# Patient Record
Sex: Female | Born: 1980 | Race: White | Hispanic: No | Marital: Single | Smoking: Never smoker
Health system: Southern US, Community
[De-identification: ages and names within clinical notes are randomized; demographics above are authoritative.]

## PROBLEM LIST (undated history)

## (undated) DIAGNOSIS — Z9889 Other specified postprocedural states: Secondary | ICD-10-CM

## (undated) DIAGNOSIS — J189 Pneumonia, unspecified organism: Secondary | ICD-10-CM

## (undated) DIAGNOSIS — A498 Other bacterial infections of unspecified site: Secondary | ICD-10-CM

## (undated) DIAGNOSIS — R112 Nausea with vomiting, unspecified: Secondary | ICD-10-CM

## (undated) HISTORY — PX: CHOLECYSTECTOMY: SHX55

---
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
## 1998-12-13 ENCOUNTER — Ambulatory Visit (HOSPITAL_COMMUNITY): Admission: RE | Admit: 1998-12-13 | Discharge: 1998-12-13 | Payer: Self-pay | Admitting: Orthopedic Surgery

## 1998-12-13 ENCOUNTER — Encounter: Payer: Self-pay | Admitting: Orthopedic Surgery

## 1999-05-30 ENCOUNTER — Other Ambulatory Visit: Admission: RE | Admit: 1999-05-30 | Discharge: 1999-05-30 | Payer: Self-pay | Admitting: *Deleted

## 2000-03-31 ENCOUNTER — Encounter: Admission: RE | Admit: 2000-03-31 | Discharge: 2000-06-29 | Payer: Self-pay | Admitting: Internal Medicine

## 2000-04-21 ENCOUNTER — Encounter: Admission: RE | Admit: 2000-04-21 | Discharge: 2000-04-21 | Payer: Self-pay | Admitting: Internal Medicine

## 2000-04-21 ENCOUNTER — Encounter: Payer: Self-pay | Admitting: Internal Medicine

## 2000-05-19 ENCOUNTER — Other Ambulatory Visit: Admission: RE | Admit: 2000-05-19 | Discharge: 2000-05-19 | Payer: Self-pay | Admitting: Internal Medicine

## 2000-09-17 ENCOUNTER — Ambulatory Visit (HOSPITAL_BASED_OUTPATIENT_CLINIC_OR_DEPARTMENT_OTHER): Admission: RE | Admit: 2000-09-17 | Discharge: 2000-09-17 | Payer: Self-pay | Admitting: Orthopedic Surgery

## 2001-05-13 ENCOUNTER — Encounter: Payer: Self-pay | Admitting: General Surgery

## 2001-05-13 ENCOUNTER — Emergency Department (HOSPITAL_COMMUNITY): Admission: EM | Admit: 2001-05-13 | Discharge: 2001-05-13 | Payer: Self-pay | Admitting: General Surgery

## 2001-09-30 ENCOUNTER — Emergency Department (HOSPITAL_COMMUNITY): Admission: EM | Admit: 2001-09-30 | Discharge: 2001-10-01 | Payer: Self-pay | Admitting: Emergency Medicine

## 2003-08-16 ENCOUNTER — Other Ambulatory Visit: Admission: RE | Admit: 2003-08-16 | Discharge: 2003-08-16 | Payer: Self-pay | Admitting: Obstetrics and Gynecology

## 2005-08-15 ENCOUNTER — Inpatient Hospital Stay (HOSPITAL_COMMUNITY): Admission: AD | Admit: 2005-08-15 | Discharge: 2005-08-15 | Payer: Self-pay | Admitting: Obstetrics and Gynecology

## 2005-08-17 ENCOUNTER — Inpatient Hospital Stay (HOSPITAL_COMMUNITY): Admission: AD | Admit: 2005-08-17 | Discharge: 2005-08-20 | Payer: Self-pay | Admitting: *Deleted

## 2006-05-29 ENCOUNTER — Ambulatory Visit: Payer: Self-pay | Admitting: Endocrinology

## 2007-07-05 ENCOUNTER — Encounter: Payer: Self-pay | Admitting: *Deleted

## 2007-07-05 DIAGNOSIS — N951 Menopausal and female climacteric states: Secondary | ICD-10-CM | POA: Insufficient documentation

## 2007-07-05 DIAGNOSIS — E538 Deficiency of other specified B group vitamins: Secondary | ICD-10-CM | POA: Insufficient documentation

## 2007-07-05 DIAGNOSIS — J309 Allergic rhinitis, unspecified: Secondary | ICD-10-CM | POA: Insufficient documentation

## 2007-11-21 ENCOUNTER — Inpatient Hospital Stay (HOSPITAL_COMMUNITY): Admission: AD | Admit: 2007-11-21 | Discharge: 2007-11-21 | Payer: Self-pay | Admitting: Obstetrics and Gynecology

## 2007-12-12 ENCOUNTER — Inpatient Hospital Stay (HOSPITAL_COMMUNITY): Admission: AD | Admit: 2007-12-12 | Discharge: 2007-12-12 | Payer: Self-pay | Admitting: Obstetrics & Gynecology

## 2007-12-19 ENCOUNTER — Inpatient Hospital Stay (HOSPITAL_COMMUNITY): Admission: AD | Admit: 2007-12-19 | Discharge: 2007-12-19 | Payer: Self-pay | Admitting: Obstetrics

## 2008-01-05 ENCOUNTER — Inpatient Hospital Stay (HOSPITAL_COMMUNITY): Admission: AD | Admit: 2008-01-05 | Discharge: 2008-01-06 | Payer: Self-pay | Admitting: *Deleted

## 2008-01-20 ENCOUNTER — Inpatient Hospital Stay (HOSPITAL_COMMUNITY): Admission: AD | Admit: 2008-01-20 | Discharge: 2008-01-20 | Payer: Self-pay | Admitting: Obstetrics and Gynecology

## 2008-01-21 ENCOUNTER — Inpatient Hospital Stay (HOSPITAL_COMMUNITY): Admission: AD | Admit: 2008-01-21 | Discharge: 2008-01-30 | Payer: Self-pay | Admitting: Obstetrics and Gynecology

## 2008-01-27 ENCOUNTER — Encounter (INDEPENDENT_AMBULATORY_CARE_PROVIDER_SITE_OTHER): Payer: Self-pay | Admitting: Obstetrics and Gynecology

## 2008-11-20 ENCOUNTER — Ambulatory Visit (HOSPITAL_COMMUNITY): Admission: RE | Admit: 2008-11-20 | Discharge: 2008-11-20 | Payer: Self-pay | Admitting: Gastroenterology

## 2008-11-28 ENCOUNTER — Ambulatory Visit (HOSPITAL_COMMUNITY): Admission: RE | Admit: 2008-11-28 | Discharge: 2008-11-28 | Payer: Self-pay | Admitting: Gastroenterology

## 2009-08-24 ENCOUNTER — Ambulatory Visit (HOSPITAL_BASED_OUTPATIENT_CLINIC_OR_DEPARTMENT_OTHER): Admission: RE | Admit: 2009-08-24 | Discharge: 2009-08-24 | Payer: Self-pay | Admitting: Orthopedic Surgery

## 2009-10-06 HISTORY — PX: WRIST GANGLION EXCISION: SUR520

## 2010-10-06 HISTORY — PX: OTHER SURGICAL HISTORY: SHX169

## 2011-02-18 NOTE — Op Note (Signed)
Kathryn [name], Kathryn [name] NO.:  [id number]   MEDICAL RECORD NO.:  [id number]          PATIENT TYPE:  INP   LOCATION:  9161                          [facility]   PHYSICIAN:  Lenoard Aden, M.D.DATE OF BIRTH:  1980/11/24   DATE OF PROCEDURE:  01/27/2008  DATE OF DISCHARGE:                               OPERATIVE REPORT   PREOPERATIVE DIAGNOSES:  1. 37-week intrauterine pregnancy.  2. Preeclampsia with increasing neurologic symptoms.  3. Labile blood pressure.  4. Repeat C-section and tubal ligation.   POSTOPERATIVE DIAGNOSES:  1. 37-week intrauterine pregnancy.  2. Preeclampsia with increasing neurologic symptoms.  3. Labile blood pressure.  4. Repeat C-section and tubal ligation.   PROCEDURE:  Repeat low segment transverse cesarean section and tubal  ligation.   SURGEON:  Lenoard Aden, M.D.   ASSISTANT:  Maxie Better, M.D.   ANESTHESIA:  Spinal by Burnett Corrente, M.D.   ESTIMATED BLOOD LOSS:  1000 mL.   COMPLICATIONS:  None.   DRAINS:  Foley.   COUNTS:  Correct.   The patient the patient recovery in good condition.   SPECIMEN:  Placenta, questionable solid mass from the lower uterine  segment and tubal segments to pathology.   DESCRIPTION OF PROCEDURE:  An being apprised of the risks of anesthesia,  infection, bleeding, injury to abdominal organs and need for repair,  delayed versus immediate complications to include bowel and bladder  injury, the patient was brought to the operating room where she was  administered a spinal anesthetic without complications. She was prepped  and draped in the usual sterile fashion, Foley catheter previously  placed. After achieving adequate anesthesia, dilute Marcaine solution  placed, a Pfannenstiel skin incision made in the area of her previous  Pfannenstiel skin incision which was made with a scalpel and carried  down to the fascia which was nicked in the midline transversely using  Mayo  scissors.  The  rectus muscles was dissected sharply in the midline, peritoneum entered  sharply, bladder blade placed.  The visceroperitoneum scored sharply off  the thinning lower uterine segment, hysterotomy incision made,  atraumatic delivery from an occiput transverse position a full-term  living female handed to the pediatricians in attendance after amniotomy  and clear fluid. Apgars 8 and 8. Cord blood collected. The placenta was  delivered manually. Intact three-vessel cord noted.  The uterus  exteriorized.  There is a 2-3 cm spherical oval type mass that is lying  free in the lower uterine segment.  This is sent to pathology for  confirmation.  At this time, the uterine incision is identified, no  extensions are noted. It is closed in one running imbricating layer with  a #0 Monocryl suture. Two interrupted sutures are placed for hemostasis.  Bladder flap inspected and found to be hemostatic. Irrigation  accomplished. The left tube traced out to the fimbriated end and the  isthmic portion of tube identified.  Avascular portion of the  mesosalpinx identified, a window created and distal proximal ties are  placed, zero plain suture.  Tubal segment excised.  Tubal lumen is  cauterized and visualized.  The same procedure is done on the left tube  that is done on the right tube. The tubal lumens are visualized and  cauterized. The tubal segment is sent to pathology.  Good hemostasis  noted, the ovaries appear normal.  Reinspection of the lower uterine  segment reveals a hemostatic incision, bladder flap inspected and  hemostatic.  The fascia then closed using #0 Monocryl in continuous  running fashion, subcutaneous tissue reapproximated using a 2-0 plain in  continuous running fashion, skin closed using staples.  The patient  tolerated this procedure well and was transferred to recovery in good  condition.      Lenoard Aden, M.D.  Electronically Signed     RJT/MEDQ  D:   01/27/2008  T:  01/27/2008  Job:  [id number]

## 2011-02-21 NOTE — Discharge Summary (Signed)
[name], Kathryn [name] NO.:  [id number]   MEDICAL RECORD NO.:  [id number]          PATIENT TYPE:  INP   LOCATION:  9141                          [facility]   PHYSICIAN:  Lenoard Aden, M.D.DATE OF BIRTH:  1981-06-22   DATE OF ADMISSION:  01/21/2008  DATE OF DISCHARGE:  01/30/2008                               DISCHARGE SUMMARY   The patient want to come admission for preeclampsia remote from term.  She admitted inpatient.  She was stable.  Decision based on the symptoms  and blood pressures were made to deliver the patient at 37 weeks.  She  had a complicated repeat C-section.  Postoperative course uncomplicated.  Tolerating a regular diet well.  Discharged to home on hospital day #3  on labetalol, Percocet, prenatal vitamins, and iron.  Follow up in the  office in 4 -6 weeks.      Lenoard Aden, M.D.  Electronically Signed     RJT/MEDQ  D:  03/08/2008  T:  03/09/2008  Job:  [id number]

## 2011-02-21 NOTE — Consult Note (Signed)
[facility]                            ENDOCRINOLOGY CONSULTATION   NAME:Kathryn [name], Kathryn [name]                      MRN:          [id number]  DATE:05/29/2006                            DOB:          [birth year]    ENDOSCOPIC ASSESSMENT:   REFERRED BY:  The patient is self-referred.   REASON FOR REFERRAL:  Suspected abnormality of thyroid.   HISTORY OF PRESENT ILLNESS:  A 30 year old woman with 6 months of fatigue.  She was prescribed Provigil, which initially helped.  However, she states  that she now feels back to her baseline.  She had a severe weight gain of 60  pounds with a pregnancy late last year and earlier this year.  She has lost  back 40 of those pounds, but the weight loss has now leveled off.  She  states she had some edema during the pregnancy due to her preeclampsia, but  the improvement of this since delivery had also leveled off according to the  patient.  She is currently not breast feeding .  She describes her diet and  exercise as good.   PAST MEDICAL HISTORY:  1. ADD.  2. Allergic rhinitis.   SOCIAL HISTORY:  She works as an Regulatory affairs officer.  She  is married.  She has an infant child.   FAMILY HISTORY:  Hypothyroidism in several second degree relatives.   REVIEW OF SYSTEMS:  She denies fever.  She has slight shortness of breath  with exertion.  She also has mild depression.   PHYSICAL EXAMINATION:  VITAL SIGNS:  Blood pressure 122/80.  Heart rate is  103.  Temperature 98.1.  The weight is 182.  GENERAL:  No distress.  SKIN:  Not diaphoretic.  HEENT:  No proptosis, no periorbital swelling.  NECK:  No goiter.  CHEST:  Clear to auscultation.  No respiratory distress.  CARDIOVASCULAR:  No JVD.  I do not appreciate any edema.  Regular rate and  rhythm, no murmur.  EXTREMITIES:  Gait is observed in the office to be normal.  NEUROLOGICAL:  Alert and oriented.  Does not appear anxious nor depressed  and  there is no tremor.   LABORATORY STUDIES:  Laboratory studies on May 29, 2006:  TSH 2.19.  Free  T3 of 3.  Free T4 of 0.6.  (All of these are normal).   IMPRESSION:  1. Apparent history of mildly abnormal thyroid function studies, normal on      today's check.  2. The inability to lose all of the weight she gained during pregnancy is      a common occurrence.  3. Fatigue which is also a common symptom in the mother of an infant.   PLAN:  In view of the normal thyroid function studies, I left her a message  stating that no further thyroid workup is needed and she can return here  p.r.n.                                   Sean A. Everardo All, MD  SAE/MedQ  DD:  05/31/2006  DT:  06/01/2006  Job #:  [id number]   cc:   Merlene Laughter. Renae Gloss, MD  Richardean Sale, MD

## 2011-02-21 NOTE — Op Note (Signed)
Kathryn [name], Kathryn [name]               ACCOUNT NO.:  [id number]   MEDICAL RECORD NO.:  [id number]          PATIENT TYPE:  INP   LOCATION:  9148                          [facility]  [facility]   PHYSICIAN:  [place] B. Earlene Plater, M.D.  DATE OF BIRTH:  [birth year]   DATE OF PROCEDURE:  08/17/2005  DATE OF DISCHARGE:                                 OPERATIVE REPORT   PREOPERATIVE DIAGNOSES:  1.  Thirty-seven weeks, breech.  2.  Spontaneous rupture of membranes.  3.  Hypertension.   POSTOPERATIVE DIAGNOSES:  1.  Thirty-seven weeks, breech.  2.  Spontaneous rupture of membranes.  3.  Hypertension.   OPERATION/PROCEDURE:  Primary low transverse cesarean section.   SURGEON:  Chester Holstein. Earlene Plater, M.D.   ANESTHESIA:  Spinal.   SPECIMENS:  None.   ESTIMATED BLOOD LOSS:  700.   COMPLICATIONS:  None.   OPERATIVE FINDINGS:  Viable female, complete breech, Apgars 8 and 9, weight  7 pounds 1 ounce.  Normal-appearing uterus, tubes and ovaries.   INDICATIONS:  The patient at 37+ weeks and being evaluated for elevated  blood pressure in the 140's/90's range with normal PIH labs. She was  supposed to drop off a 24-hour urine this morning.  However, rupture of  membranes around 1 a.m.  Also breech.  Therefore, recommended cesarean  section.  Risks were discussed including infection, bleeding, damage to  surrounding organs.   DESCRIPTION OF PROCEDURE:  The patient was taken to the operating room and  spinal anesthesia was obtained.  She was prepped and draped in the standard  fashion and Foley catheter inserted into the bladder.  A Pfannenstiel  incision was made with the knife and carried sharply to the fascia. The  fascia was divided sharply.  Posterior rectus muscles were dissected off  sharply.  Posterior sheath and peritoneum elevated and entered sharply.  Bladder blade inserted.  Bladder flap created with sharp and blunt  technique.  Uterine incision made in the low transverse fashion with a  knife.   Clear fluid on amniotomy.  The breech was elevated through the  incision and feet delivered by traction at the ankles to the level of the  scapula.  The arms delivered by rotation of the torso and flexion of the  elbows.  Head was delivered by flexion of the neck.  Nuchal cord x1.  Cord  was clamped and cut and infant handed off to the awaiting pediatricians.  The patient had been given 2 g of ampicillin for GBS prophylaxis while  awaiting her cesarean section as her strep test was unknown.  Therefore, no  additional prophylaxis was given.   Placenta was delivered manually.  Uterus was exteriorized and cleared of all  clots and debris.  There was no uterine extension.  The patient incision was  closed in a running, locked stitch of 0 chromic.  A second imbricating layer  was placed and hemostasis was obtained.   Uterus reinserted.  Pelvic irrigated.  Uterine incision and bladder flap and  subfascial space were all hemostatic.  The fascia was closed with a running  stitch of  0 Vicryl.  Subcutaneous tissue was irrigated and was hemostatic  and was closed with a running stitch of plain gut suture.  Skin was closed  with staples.  The patient tolerated the procedure well without  complications.  She was taken to the recovery room awake, alert, in stable  condition.  All counts correct by the operating room staff.      Gerri Spore B. Earlene Plater, M.D.  Electronically Signed     WBD/MEDQ  D:  08/17/2005  T:  08/17/2005  Job:  [id number]

## 2011-02-21 NOTE — H&P (Signed)
NAMELARRAINE, ARGO NO.:  [id number]   MEDICAL RECORD NO.:  [id number]          PATIENT TYPE:  MAT   LOCATION:  MATC                          [facility]:  [facility]   PHYSICIAN:  Lenoard Aden, M.D.DATE OF BIRTH:  21-Oct-1980   DATE OF ADMISSION:  11/21/2007  DATE OF DISCHARGE:  11/21/2007                              HISTORY & PHYSICAL   CHIEF COMPLAINT:  Rule out preterm labor in a 30 year old white female  G1, P1, 27-3/[redacted] weeks gestation with increased pressure and contractions.  She has no history of preterm birth or preterm labor.   ALLERGIES:  NO KNOWN DRUG ALLERGIES.   CURRENT MEDICATIONS:  Prenatal vitamins.   SOCIAL HISTORY:  Nonsmoker, nondrinker.  Denies domestic or physical  violence.   FAMILY HISTORY:  Myocardial infarction, heart disease, and bone cancer.   PAST SURGICAL HISTORY:  1. Previous history of C-section.  2. Jaw reconstructive surgery.  3. Ovarian cyst without surgical intervention.   PHYSICAL EXAMINATION:  GENERAL:  Well-developed, well-nourished white  female in no acute distress.  HEENT:  Normal.  LUNGS:  Clear.  ABDOMEN:  Benign, soft, gravid, nontender.  GENITOURINARY:  Cervix closed, long pelvis.  NST reactive.  No  contractions noted.   IMPRESSION:  A 27-week obstetric with no preterm labor.   PLAN:  Reassurance given.  Discharge home.      Lenoard Aden, M.D.  Electronically Signed     RJT/MEDQ  D:  11/22/2007  T:  11/23/2007  Job:  [id number]

## 2011-02-21 NOTE — Op Note (Signed)
[place]  Patient:    Kathryn [name], Kathryn [name]                     MRN: [id number] Proc. Date: 09/17/00 Adm. Date:  [id number] Attending:  Ronne Binning                           Operative Report  PREOPERATIVE DIAGNOSIS:  Ulnar wrist pain, left wrist.  POSTOPERATIVE DIAGNOSIS:  Ulnar wrist pain, left wrist.  PROCEDURE:  Arthroscopy, debridement of lunotriquetral tear and partial avulsion cartilage, ulnar lunate, left wrist.  SURGEON:  Nicki Reaper, M.D.  ASSISTANT:  Joaquin Courts, R.N.  ANESTHESIA:  General.  ANESTHESIOLOGIST:  Halford Decamp, M.D.  CLINICAL NOTE:  The patient is a 30 year old female suffering a fall and injury to her left wrist.  This has not responded to conservative treatment.  DESCRIPTION OF PROCEDURE:  The patient was brought to the operating room, where a general anesthetic was carried out without difficulty.  She was prepped and draped using Betadine scrub and solution in supine position, left arm free.  The limb was placed in the arthroscopy tower, 10 pounds of traction applied, joint inflated through the 3/4 portal.  Transverse incision was made through skin only, deepened with a hemostat.  Blunt trocar was used to enter the joint.  The joint was inspected.  The volar radial wrist ligaments were intact.  The ulnar wrist ligaments were intact.  Scapholunate ligament was intact.  A large prestyloid recess was present.  A mild synovitis was present. This was explored and found not to have any tears.  The TFCC was intact.  A lunotriquetral tear was noted with a partial avulsion of the cartilage in the ulnar aspect of the lunate.  An irrigation catheter was placed in 6U, a 4/5 portal opened after localization with a 22-gauge needle.  A transverse incision was made through skin only, deepened with a hemostat.  A blunt trocar was used to enter the joint.  The joint was inspected using the 4/5 portal, and the tear of the  ulnar lunotriquetral ligament identified.  The volar ligaments were intact.  The dorsal aspect was intact.  The scope was then reintroduced at 3/4.  The instrumentation was through 4/5.  Using an Arthrowand and a full-radius shaver and basket forceps, the area of the cartilage avulsion and lunotriquetral ligament tear was debrided.  The midcarpal joint was then inspected through the distal 3/4 portal.  An irrigation catheter placed in the ulnar 4/5 portal.  The STT joint showed no arthritic changes.  There was no opening of the scapholunate.  The proximal capitate, distal aspect of the lunate, triquetrum, and hamate were all intact. There was a hamate facet on the lunate, but no arthritic changes were present. The instrumentation was removed, the portal closed with interrupted 5-0 nylon suture, a sterile compressive dressing and splint were applied.  The patient tolerated the procedure well and was taken to the recovery room for observation in satisfactory condition.  She is discharged home, to return to the [facility] in one week, on Vicodin and Keflex. DD:  09/17/00 TD:  09/17/00 Job: [id number]

## 2011-02-21 NOTE — Discharge Summary (Signed)
NAMEBREELEY, Kathryn [name]               ACCOUNT NO.:  [id number]   MEDICAL RECORD NO.:  [id number]          PATIENT TYPE:  INP   LOCATION:  9148                          [facility]  [facility]   PHYSICIAN:  [place] B. Earlene Plater, M.D.  DATE OF BIRTH:  1980-10-08   DATE OF ADMISSION:  08/17/2005  DATE OF DISCHARGE:  08/20/2005                                 DISCHARGE SUMMARY   ADMITTING DIAGNOSES:  1.  37-week intrauterine pregnancy.  2.  Breech presentation.  3.  Spontaneous rupture of membranes.  4.  Hypertension.   DISCHARGE DIAGNOSES:  1.  37-week intrauterine pregnancy.  2.  Breech presentation.  3.  Spontaneous rupture of membranes.  4.  Hypertension.   PROCEDURE:  Primary low transverse cesarean section.   HISTORY OF PRESENT ILLNESS:  A 30 year old white female who presented to the  office with complaints of irregular contractions and leakage of fluid.  Sterile speculum examination confirmed rupture of membranes and breech  presentation was confirmed on ultrasound.   HOSPITAL COURSE:  Patient was admitted.  Underwent primary low transverse  cesarean section.  Findings included viable female, Apgars 8 and 9, complete  breech presentation 7 pounds 1 ounces.  Normal-appearing uterus, tubes, and  ovaries.   Postoperatively patient rapidly regained her ability to ambulate, void, and  tolerate a regular diet.  She was discharged home on the third postoperative  day in satisfactory condition.   DISCHARGE INSTRUCTIONS:  Standard pre-printed instructions given prior to  dismissal.   DISCHARGE MEDICATIONS:  Tylox one to two p.o. q.4-6h. p.r.n. pain.   FOLLOW-UP:  [facility] one week for blood pressure check.   DISPOSITION:  Discharged satisfactory.     Gerri Spore B. Earlene Plater, M.D.  Electronically Signed    WBD/MEDQ  D:  09/11/2005  T:  09/11/2005  Job:  [id number]

## 2011-04-15 ENCOUNTER — Other Ambulatory Visit: Payer: Self-pay | Admitting: Obstetrics and Gynecology

## 2011-06-16 ENCOUNTER — Other Ambulatory Visit: Payer: Self-pay | Admitting: Internal Medicine

## 2011-06-16 DIAGNOSIS — D490 Neoplasm of unspecified behavior of digestive system: Secondary | ICD-10-CM

## 2011-06-17 ENCOUNTER — Other Ambulatory Visit: Payer: Self-pay

## 2011-06-18 ENCOUNTER — Ambulatory Visit
Admission: RE | Admit: 2011-06-18 | Discharge: 2011-06-18 | Disposition: A | Discharge status: Home / Self Care | Payer: Managed Care, Other (non HMO) | Source: Ambulatory Visit | Attending: Internal Medicine | Admitting: Internal Medicine

## 2011-06-18 DIAGNOSIS — D490 Neoplasm of unspecified behavior of digestive system: Secondary | ICD-10-CM

## 2011-06-30 LAB — CBC
HCT: 33.1 — ABNORMAL LOW
Hemoglobin: 11.5 — ABNORMAL LOW
MCV: 84
MCV: 83.6
RBC: 3.94
RBC: 3.92
WBC: 10.7 — ABNORMAL HIGH
WBC: 9.6

## 2011-06-30 LAB — COMPREHENSIVE METABOLIC PANEL
ALT: 14
AST: 20
Alkaline Phosphatase: 87
BUN: 5 — ABNORMAL LOW
CO2: 26
CO2: 27
Chloride: 109
Chloride: 107
Creatinine, Ser: 0.51
GFR calc Af Amer: >60
GFR calc non Af Amer: >60
GFR calc non Af Amer: >60
Potassium: 3.8
Sodium: 140
Total Bilirubin: 0.5
Total Bilirubin: 0.5

## 2011-06-30 LAB — URINALYSIS, ROUTINE W REFLEX MICROSCOPIC
Glucose, UA: NEGATIVE
Glucose, UA: NEGATIVE
Hgb urine dipstick: NEGATIVE
Specific Gravity, Urine: 1.02
Urobilinogen, UA: 0.2
pH: 7
pH: 7

## 2011-06-30 LAB — URIC ACID: Uric Acid, Serum: 5.4

## 2011-07-01 LAB — URINE MICROSCOPIC-ADD ON

## 2011-07-01 LAB — URIC ACID
Uric Acid, Serum: 6.5
Uric Acid, Serum: 7.2 — ABNORMAL HIGH
Uric Acid, Serum: 8.1 — ABNORMAL HIGH
Uric Acid, Serum: 8.2 — ABNORMAL HIGH

## 2011-07-01 LAB — CBC
HCT: 30.9 — ABNORMAL LOW
HCT: 31 — ABNORMAL LOW
HCT: 32.7 — ABNORMAL LOW
HCT: 35.7 — ABNORMAL LOW
Hemoglobin: 10.3 — ABNORMAL LOW
Hemoglobin: 10.7 — ABNORMAL LOW
Hemoglobin: 10.8 — ABNORMAL LOW
Hemoglobin: 11.3 — ABNORMAL LOW
Hemoglobin: 12.5
Hemoglobin: 12.7
MCHC: 34.4
MCHC: 34
MCHC: 34.4
MCHC: 34.7
MCHC: 34.9
MCHC: 35
MCV: 84.6
MCV: 84.7
MCV: 84.8
Platelets: 123 — ABNORMAL LOW
Platelets: 155
RBC: 3.65 — ABNORMAL LOW
RBC: 3.83 — ABNORMAL LOW
RBC: 3.88
RBC: 4.08
RDW: 14.9
RDW: 15.4
RDW: 15.7 — ABNORMAL HIGH
RDW: 15.7 — ABNORMAL HIGH
RDW: 15.9 — ABNORMAL HIGH
RDW: 16 — ABNORMAL HIGH
WBC: 10.7 — ABNORMAL HIGH
WBC: 7.1

## 2011-07-01 LAB — COMPREHENSIVE METABOLIC PANEL
ALT: 14
ALT: 14
ALT: 14
ALT: 14
ALT: 16
AST: 22
AST: 23
Albumin: 2 — ABNORMAL LOW
Alkaline Phosphatase: 123 — ABNORMAL HIGH
Alkaline Phosphatase: 134 — ABNORMAL HIGH
Alkaline Phosphatase: 146 — ABNORMAL HIGH
BUN: 10
BUN: 5 — ABNORMAL LOW
BUN: 8
CO2: 22
CO2: 23
CO2: 24
Calcium: 9.2
Calcium: 8.2 — ABNORMAL LOW
Calcium: 8.4
Calcium: 8.9
Creatinine, Ser: 0.71
Creatinine, Ser: 0.71
Creatinine, Ser: 0.73
GFR calc Af Amer: >60
GFR calc Af Amer: >60
GFR calc non Af Amer: >60
GFR calc non Af Amer: >60
Glucose, Bld: 71
Glucose, Bld: 77
Glucose, Bld: 78
Glucose, Bld: 81
Glucose, Bld: 88
Potassium: 3.4 — ABNORMAL LOW
Potassium: 3.6
Potassium: 4
Sodium: 136
Sodium: 131 — ABNORMAL LOW
Sodium: 135
Sodium: 136
Sodium: 137
Total Protein: 5.5 — ABNORMAL LOW
Total Protein: 4.2 — ABNORMAL LOW
Total Protein: 4.8 — ABNORMAL LOW
Total Protein: 5 — ABNORMAL LOW
Total Protein: 5.9 — ABNORMAL LOW
Total Protein: 5.9 — ABNORMAL LOW

## 2011-07-01 LAB — PROTEIN, URINE, 24 HOUR
Collection Interval-UPROT: 24
Protein, 24H Urine: 483 — ABNORMAL HIGH
Urine Total Volume-UPROT: 3450

## 2011-07-01 LAB — DIFFERENTIAL
Eosinophils Absolute: 0
Lymphocytes Relative: 6 — ABNORMAL LOW
Lymphocytes Relative: 9 — ABNORMAL LOW
Lymphs Abs: 0.8
Lymphs Abs: 0.9
Monocytes Relative: 4
Monocytes Relative: 5
Neutro Abs: 12.7 — ABNORMAL HIGH
Neutrophils Relative %: 86 — ABNORMAL HIGH
Neutrophils Relative %: 90 — ABNORMAL HIGH

## 2011-07-01 LAB — CREATININE CLEARANCE, URINE, 24 HOUR
Collection Interval-CRCL: 24
Urine Total Volume-CRCL: 3450

## 2011-07-01 LAB — URINALYSIS, ROUTINE W REFLEX MICROSCOPIC
Bilirubin Urine: NEGATIVE
Glucose, UA: NEGATIVE
Ketones, ur: 15 — AB
Leukocytes, UA: NEGATIVE
Nitrite: NEGATIVE
Protein, ur: 30 — AB
Protein, ur: NEGATIVE
Specific Gravity, Urine: 1.015
Specific Gravity, Urine: 1.02
Urobilinogen, UA: 0.2

## 2011-07-01 LAB — LACTATE DEHYDROGENASE
LDH: 134
LDH: 134
LDH: 146
LDH: 166

## 2011-07-01 LAB — CULTURE, BLOOD (ROUTINE X 2)

## 2011-09-01 ENCOUNTER — Encounter: Payer: Self-pay | Admitting: *Deleted

## 2011-09-01 ENCOUNTER — Emergency Department (HOSPITAL_COMMUNITY)
Admission: EM | Admit: 2011-09-01 | Discharge: 2011-09-01 | Disposition: A | Discharge status: Home / Self Care | Payer: Managed Care, Other (non HMO) | Attending: Emergency Medicine | Admitting: Emergency Medicine

## 2011-09-01 ENCOUNTER — Emergency Department (HOSPITAL_COMMUNITY): Payer: Managed Care, Other (non HMO)

## 2011-09-01 DIAGNOSIS — S93409A Sprain of unspecified ligament of unspecified ankle, initial encounter: Secondary | ICD-10-CM | POA: Insufficient documentation

## 2011-09-01 DIAGNOSIS — M25473 Effusion, unspecified ankle: Secondary | ICD-10-CM | POA: Insufficient documentation

## 2011-09-01 DIAGNOSIS — M25476 Effusion, unspecified foot: Secondary | ICD-10-CM | POA: Insufficient documentation

## 2011-09-01 DIAGNOSIS — M25579 Pain in unspecified ankle and joints of unspecified foot: Secondary | ICD-10-CM | POA: Insufficient documentation

## 2011-09-01 DIAGNOSIS — W19XXXA Unspecified fall, initial encounter: Secondary | ICD-10-CM | POA: Insufficient documentation

## 2011-09-01 NOTE — Progress Notes (Signed)
Orthopedic Tech Progress Note Patient Details:  Kathryn [name] 1981-04-29 [id number]  Other Ortho Devices Type of Ortho Device: Crutches  Type of Splint: Ankle Air Splint Location: (R) LE Splint Interventions: Application    Jennye Moccasin 09/01/2011, 5:56 PM

## 2011-09-01 NOTE — ED Provider Notes (Signed)
History     CSN: [id number] Arrival date & time: 09/01/2011  3:58 PM   First MD Initiated Contact with Patient 09/01/11 1736      Chief Complaint  Patient presents with  . Ankle Pain    (Consider location/radiation/quality/duration/timing/severity/associated sxs/prior treatment) Patient is a 30 y.o. female presenting with ankle pain. The history is provided by the patient. No language interpreter was used.  Ankle Pain  The incident occurred 3 to 5 hours ago. The incident occurred at home. The injury mechanism was a fall. The pain is present in the right ankle. The quality of the pain is described as aching. The pain is mild. The pain has been intermittent since onset. Associated symptoms include inability to bear weight. She reports no foreign bodies present. The symptoms are aggravated by palpation, bearing weight and activity. She has tried rest for the symptoms. The treatment provided no relief.    History reviewed. No pertinent past medical history.  History reviewed. No pertinent past surgical history.  History reviewed. No pertinent family history.  History  Substance Use Topics  . Smoking status: Never Smoker   . Smokeless tobacco: Not on file  . Alcohol Use: No    OB History    Grav Para Term Preterm Abortions TAB SAB Ect Mult Living                  Review of Systems  Musculoskeletal: Positive for joint swelling and arthralgias.  All other systems reviewed and are negative.    Allergies  Erythromycin  Home Medications  No current outpatient prescriptions on file.  BP 124/84  Pulse 109  Temp(Src) 98.4 F (36.9 C) (Oral)  Resp 20  SpO2 99%  LMP 08/25/2011  Physical Exam  Nursing note and vitals reviewed. Constitutional: She is oriented to person, place, and time. She appears well-developed and well-nourished.  HENT:  Head: Normocephalic and atraumatic.  Eyes: Pupils are equal, round, and reactive to light.  Neck: Normal range of motion.    Cardiovascular: Normal rate, regular rhythm and normal heart sounds.   Pulmonary/Chest: Effort normal and breath sounds normal.  Abdominal: Soft. Bowel sounds are normal.  Musculoskeletal: She exhibits tenderness.       Right ankle: She exhibits swelling.       Feet:  Neurological: She is alert and oriented to person, place, and time.  Skin: Skin is warm and dry.  Psychiatric: She has a normal mood and affect.    ED Course  Procedures (including critical care time)  Labs Reviewed - No data to display Dg Ankle Complete Right  09/01/2011  *RADIOLOGY REPORT*  Clinical Data: Twisted right ankle.  RIGHT ANKLE - COMPLETE 3+ VIEW  Comparison: None  Findings: The ankle mortise is maintained.  No acute fracture or osteochondral lesion.  The visualized mid and hind foot to bony structures are intact.  IMPRESSION: No acute fracture.  Original Report Authenticated By: P. Loralie Champagne, M.D.     No diagnosis found.  Radiology results reviewed and discussed with patient.  Splint and crutches ordered.  Patient to follow-up with orthopedics if no improvement over the next 7-10 days.  MDM   Ankle sprain       Jimmye Norman, NP 09/01/11 [phone]

## 2011-09-01 NOTE — ED Notes (Signed)
Pt c/o pain in her rt ankle.  She tripped and fell approx 1300  Earlier today.  Swollen laterally.

## 2011-09-01 NOTE — ED Notes (Signed)
Pt rolled rt ankle today at home. Presents with swollen lower extremity. Cms/rom intact.

## 2011-09-02 NOTE — ED Provider Notes (Signed)
Medical screening examination/treatment/procedure(s) were performed by non-physician practitioner and as supervising physician I was immediately available for consultation/collaboration.  Hurman Horn, MD 09/02/11 1435

## 2011-10-07 HISTORY — PX: ABDOMINAL HYSTERECTOMY: SHX81

## 2011-10-22 ENCOUNTER — Encounter (HOSPITAL_COMMUNITY): Payer: Self-pay | Admitting: Pharmacist

## 2011-10-24 ENCOUNTER — Other Ambulatory Visit: Payer: Self-pay | Admitting: Obstetrics and Gynecology

## 2011-10-28 ENCOUNTER — Encounter (HOSPITAL_COMMUNITY)
Admission: RE | Admit: 2011-10-28 | Discharge: 2011-10-28 | Disposition: A | Discharge status: Home / Self Care | Payer: Managed Care, Other (non HMO) | Source: Ambulatory Visit | Attending: Obstetrics and Gynecology | Admitting: Obstetrics and Gynecology

## 2011-10-28 ENCOUNTER — Encounter (HOSPITAL_COMMUNITY): Payer: Self-pay

## 2011-10-28 HISTORY — DX: Nausea with vomiting, unspecified: R11.2

## 2011-10-28 HISTORY — DX: Other specified postprocedural states: Z98.890

## 2011-10-28 LAB — CBC
HCT: 40.4 % (ref 36.0–46.0)
Hemoglobin: 12.8 g/dL (ref 12.0–15.0)
MCH: 26.2 pg (ref 26.0–34.0)
MCHC: 31.7 g/dL (ref 30.0–36.0)

## 2011-10-28 LAB — SURGICAL PCR SCREEN: MRSA, PCR: NEGATIVE

## 2011-10-28 NOTE — Patient Instructions (Addendum)
   Your procedure is scheduled on: Tuesday January 29th  Enter through the Main Entrance of [facility] at: 11:30am Pick up the phone at the desk and dial [phone] and inform us of your arrival.  Please call this number if you have any problems the morning of surgery: [phone]  Remember: Do not eat food after midnight: Monday Do not drink clear liquids after: 9am Tuesday  Take these medicines the morning of surgery with a SIP OF WATER:none  Do not wear jewelry, make-up, or FINGER nail polish Do not wear lotions, powders, perfumes or deodorant. Do not shave 48 hours prior to surgery. Do not bring valuables to the hospital.  Leave suitcase in the car. After Surgery it may be brought to your room. For patients being admitted to the hospital, checkout time is 11:00am the day of discharge.  Patients discharged on the day of surgery will not be allowed to drive home.     Remember to use your hibiclens as instructed.Please shower with 1/2 bottle the evening before your surgery and the other 1/2 bottle the morning of surgery.

## 2011-11-03 ENCOUNTER — Other Ambulatory Visit: Payer: Self-pay | Admitting: Obstetrics and Gynecology

## 2011-11-03 NOTE — H&P (Signed)
Kathryn [name], Kathryn [name] NO.:  [id number]  MEDICAL RECORD NO.:  [id number]  LOCATION:  PERIO                         FACILITY:  [facility]  PHYSICIAN:  Lenoard Aden, M.D.DATE OF BIRTH:  [birth year]  DATE OF ADMISSION:  09/15/2011 DATE OF DISCHARGE:  10/28/2011                             HISTORY & PHYSICAL   CHIEF COMPLAINT:  Dysmenorrhea and menorrhagia.  HISTORY OF PRESENT ILLNESS:  She is a 31 year old white female, G2, P2, history of C-section x2 who presents now with worsening dysmenorrhea and menorrhagia for definitive therapy.  ALLERGIES:  To known medications, ERYTHROMYCIN causing GI intolerance.  FAMILY HISTORY:  Bone cancer, heart disease.  SOCIAL HISTORY:  She is a nonsmoker, nondrinker.  Denies domestic or physical violence.  PAST SURGICAL HISTORY:  History of C-section x2 with tubal ligation, history of jaw surgery, cholecystectomy.  PHYSICAL EXAMINATION:  GENERAL:  She is a well-developed, well-nourished white female, in no acute distress.  VITAL SIGNS:  Weight of 222 pounds, height of 65 inches. HEENT:  Normal. NECK:  Supple.  Full range of motion. LUNGS:  Clear. HEART:  Regular rate and rhythm. ABDOMEN:  Soft, nontender. PELVIC:  A bulky anteflexed uterus and no adnexal masses. EXTREMITIES:  There are no cords. NEUROLOGIC:  Nonfocal. SKIN:  Intact.  IMPRESSION: 1. Symptomatic dysmenorrhea and menorrhagia. 2. History of cesarean section x2 with tubal ligation.  PLAN:  Proceed with da Vinci assisted total laparoscopic hysterectomy, bilateral salpingectomy, risks of anesthesia, infection, bleeding, injury to abdominal organs, need for repair was discussed, delayed versus immediate complications including bowel and bladder injury noted, inability to cure all pelvic pain was discussed.  The patient acknowledges and wishes to proceed.     Lenoard Aden, M.D.     RJT/MEDQ  D:  11/03/2011  T:  11/03/2011  Job:  [id number]

## 2011-11-04 ENCOUNTER — Other Ambulatory Visit: Payer: Self-pay | Admitting: Obstetrics and Gynecology

## 2011-11-04 ENCOUNTER — Ambulatory Visit (HOSPITAL_COMMUNITY)
Admission: RE | Admit: 2011-11-04 | Discharge: 2011-11-05 | Disposition: A | Discharge status: Home / Self Care | Payer: Managed Care, Other (non HMO) | Source: Ambulatory Visit | Attending: Obstetrics and Gynecology | Admitting: Obstetrics and Gynecology

## 2011-11-04 ENCOUNTER — Ambulatory Visit (HOSPITAL_COMMUNITY): Payer: Managed Care, Other (non HMO) | Admitting: Anesthesiology

## 2011-11-04 ENCOUNTER — Encounter (HOSPITAL_COMMUNITY): Payer: Self-pay | Admitting: *Deleted

## 2011-11-04 ENCOUNTER — Encounter (HOSPITAL_COMMUNITY)
Admission: RE | Disposition: A | Discharge status: Home / Self Care | Payer: Self-pay | Source: Ambulatory Visit | Attending: Obstetrics and Gynecology

## 2011-11-04 ENCOUNTER — Encounter (HOSPITAL_COMMUNITY): Payer: Self-pay | Admitting: Anesthesiology

## 2011-11-04 DIAGNOSIS — N920 Excessive and frequent menstruation with regular cycle: Secondary | ICD-10-CM | POA: Insufficient documentation

## 2011-11-04 DIAGNOSIS — N946 Dysmenorrhea, unspecified: Secondary | ICD-10-CM | POA: Insufficient documentation

## 2011-11-04 LAB — PREGNANCY, URINE: Preg Test, Ur: NEGATIVE

## 2011-11-04 SURGERY — ROBOTIC ASSISTED TOTAL HYSTERECTOMY
Anesthesia: General | Site: Abdomen | Wound class: Clean Contaminated

## 2011-11-04 MED ORDER — PROPOFOL 10 MG/ML IV EMUL
INTRAVENOUS | Status: DC | PRN
  Administered 2011-11-04: 200 mg via INTRAVENOUS

## 2011-11-04 MED ORDER — GLYCOPYRROLATE 0.2 MG/ML IJ SOLN
INTRAMUSCULAR | Status: DC | PRN
  Administered 2011-11-04: .4 mg via INTRAVENOUS

## 2011-11-04 MED ORDER — DROPERIDOL 2.5 MG/ML IJ SOLN
INTRAMUSCULAR | Status: AC
  Filled 2011-11-04: qty 2

## 2011-11-04 MED ORDER — GLYCOPYRROLATE 0.2 MG/ML IJ SOLN
INTRAMUSCULAR | Status: AC
  Filled 2011-11-04: qty 1

## 2011-11-04 MED ORDER — SODIUM CHLORIDE 0.9 % IJ SOLN: 9.0000 mL | INTRAMUSCULAR | Status: DC | PRN

## 2011-11-04 MED ORDER — SUCCINYLCHOLINE CHLORIDE 20 MG/ML IJ SOLN
INTRAMUSCULAR | Status: DC | PRN
  Administered 2011-11-04: 140 mg via INTRAVENOUS

## 2011-11-04 MED ORDER — METOCLOPRAMIDE HCL 5 MG/ML IJ SOLN
INTRAMUSCULAR | Status: AC
  Filled 2011-11-04: qty 2

## 2011-11-04 MED ORDER — CEFAZOLIN SODIUM 1-5 GM-% IV SOLN
1.0000 g | INTRAVENOUS | Status: AC
  Administered 2011-11-04: 1 g via INTRAVENOUS

## 2011-11-04 MED ORDER — ROCURONIUM BROMIDE 100 MG/10ML IV SOLN
INTRAVENOUS | Status: DC | PRN
  Administered 2011-11-04 (×2): 10 mg via INTRAVENOUS
  Administered 2011-11-04: 45 mg via INTRAVENOUS
  Administered 2011-11-04: 5 mg via INTRAVENOUS
  Administered 2011-11-04: 20 mg via INTRAVENOUS

## 2011-11-04 MED ORDER — SCOPOLAMINE 1 MG/3DAYS TD PT72
1.0000 | MEDICATED_PATCH | Freq: Once | TRANSDERMAL | Status: DC
  Administered 2011-11-04: 1.5 mg via TRANSDERMAL

## 2011-11-04 MED ORDER — KETOROLAC TROMETHAMINE 60 MG/2ML IM SOLN
INTRAMUSCULAR | Status: AC
Start: 2011-11-04 — End: 2011-11-04
  Filled 2011-11-04: qty 2

## 2011-11-04 MED ORDER — SODIUM CHLORIDE 0.9 % IJ SOLN
INTRAMUSCULAR | Status: DC | PRN
  Administered 2011-11-04: 10 mL

## 2011-11-04 MED ORDER — DEXAMETHASONE SODIUM PHOSPHATE 10 MG/ML IJ SOLN
INTRAMUSCULAR | Status: AC
  Filled 2011-11-04: qty 1

## 2011-11-04 MED ORDER — PROPOFOL 10 MG/ML IV EMUL
INTRAVENOUS | Status: AC
  Filled 2011-11-04: qty 20

## 2011-11-04 MED ORDER — MIDAZOLAM HCL 5 MG/5ML IJ SOLN
INTRAMUSCULAR | Status: DC | PRN
  Administered 2011-11-04: 2 mg via INTRAVENOUS

## 2011-11-04 MED ORDER — FENTANYL CITRATE 0.05 MG/ML IJ SOLN
INTRAMUSCULAR | Status: DC | PRN
  Administered 2011-11-04: 100 ug via INTRAVENOUS
  Administered 2011-11-04: 50 ug via INTRAVENOUS
  Administered 2011-11-04: 100 ug via INTRAVENOUS

## 2011-11-04 MED ORDER — DEXTROSE-NACL 5-0.45 % IV SOLN
INTRAVENOUS | Status: DC
  Administered 2011-11-04: 23:00:00 via INTRAVENOUS

## 2011-11-04 MED ORDER — ONDANSETRON HCL 4 MG/2ML IJ SOLN: 4.0000 mg | Freq: Four times a day (QID) | INTRAMUSCULAR | Status: DC | PRN

## 2011-11-04 MED ORDER — DIPHENHYDRAMINE HCL 12.5 MG/5ML PO ELIX
12.5000 mg | ORAL_SOLUTION | Freq: Four times a day (QID) | ORAL | Status: DC | PRN
  Filled 2011-11-04: qty 5

## 2011-11-04 MED ORDER — NALOXONE HCL 0.4 MG/ML IJ SOLN: 0.4000 mg | INTRAMUSCULAR | Status: DC | PRN

## 2011-11-04 MED ORDER — HYDROMORPHONE 0.3 MG/ML IV SOLN
INTRAVENOUS | Status: DC
  Administered 2011-11-04: 18:00:00 via INTRAVENOUS
  Administered 2011-11-04: 0.599 mg via INTRAVENOUS
  Administered 2011-11-05: 0.2 mg via INTRAVENOUS

## 2011-11-04 MED ORDER — ZOLPIDEM TARTRATE 5 MG PO TABS: 5.0000 mg | ORAL_TABLET | Freq: Every evening | ORAL | Status: DC | PRN

## 2011-11-04 MED ORDER — ONDANSETRON HCL 4 MG/2ML IJ SOLN
INTRAMUSCULAR | Status: AC
  Filled 2011-11-04: qty 2

## 2011-11-04 MED ORDER — ACETAMINOPHEN 10 MG/ML IV SOLN
1000.0000 mg | Freq: Four times a day (QID) | INTRAVENOUS | Status: DC | PRN
  Administered 2011-11-04: 1000 mg via INTRAVENOUS
  Filled 2011-11-04: qty 100

## 2011-11-04 MED ORDER — DIPHENHYDRAMINE HCL 50 MG/ML IJ SOLN
INTRAMUSCULAR | Status: AC
  Filled 2011-11-04: qty 1

## 2011-11-04 MED ORDER — FENTANYL CITRATE 0.05 MG/ML IJ SOLN
INTRAMUSCULAR | Status: AC
  Filled 2011-11-04: qty 5

## 2011-11-04 MED ORDER — MIDAZOLAM HCL 2 MG/2ML IJ SOLN
INTRAMUSCULAR | Status: AC
  Filled 2011-11-04: qty 2

## 2011-11-04 MED ORDER — HYDROMORPHONE 0.3 MG/ML IV SOLN
INTRAVENOUS | Status: AC
  Filled 2011-11-04: qty 25

## 2011-11-04 MED ORDER — DIPHENHYDRAMINE HCL 50 MG/ML IJ SOLN: 12.5000 mg | Freq: Four times a day (QID) | INTRAMUSCULAR | Status: DC | PRN

## 2011-11-04 MED ORDER — NEOSTIGMINE METHYLSULFATE 1 MG/ML IJ SOLN
INTRAMUSCULAR | Status: DC | PRN
  Administered 2011-11-04: 3 mg via INTRAVENOUS

## 2011-11-04 MED ORDER — LIDOCAINE HCL (CARDIAC) 20 MG/ML IV SOLN
INTRAVENOUS | Status: DC | PRN
  Administered 2011-11-04: 80 mg via INTRAVENOUS

## 2011-11-04 MED ORDER — DIPHENHYDRAMINE HCL 50 MG/ML IJ SOLN
INTRAMUSCULAR | Status: DC | PRN
  Administered 2011-11-04: 12.5 mg via INTRAVENOUS

## 2011-11-04 MED ORDER — PROMETHAZINE HCL 25 MG/ML IJ SOLN: 6.2500 mg | INTRAMUSCULAR | Status: DC | PRN

## 2011-11-04 MED ORDER — DROPERIDOL 2.5 MG/ML IJ SOLN
INTRAMUSCULAR | Status: DC | PRN
  Administered 2011-11-04: 0.625 mg via INTRAVENOUS

## 2011-11-04 MED ORDER — BUPIVACAINE HCL (PF) 0.25 % IJ SOLN
INTRAMUSCULAR | Status: DC | PRN
  Administered 2011-11-04: 10 mL

## 2011-11-04 MED ORDER — TRAMADOL HCL 50 MG PO TABS: 50.0000 mg | ORAL_TABLET | Freq: Four times a day (QID) | ORAL | Status: DC | PRN

## 2011-11-04 MED ORDER — KETOROLAC TROMETHAMINE 60 MG/2ML IM SOLN
INTRAMUSCULAR | Status: DC | PRN
  Administered 2011-11-04: 30 mg via INTRAMUSCULAR

## 2011-11-04 MED ORDER — ROCURONIUM BROMIDE 50 MG/5ML IV SOLN
INTRAVENOUS | Status: AC
  Filled 2011-11-04: qty 2

## 2011-11-04 MED ORDER — LACTATED RINGERS IR SOLN
Status: DC | PRN
  Administered 2011-11-04: 3000 mL

## 2011-11-04 MED ORDER — SUCCINYLCHOLINE CHLORIDE 20 MG/ML IJ SOLN
INTRAMUSCULAR | Status: AC
  Filled 2011-11-04: qty 10

## 2011-11-04 MED ORDER — SCOPOLAMINE 1 MG/3DAYS TD PT72
MEDICATED_PATCH | TRANSDERMAL | Status: AC
  Administered 2011-11-04: 1.5 mg via TRANSDERMAL
  Filled 2011-11-04: qty 1

## 2011-11-04 MED ORDER — OXYCODONE-ACETAMINOPHEN 5-325 MG PO TABS: 1.0000 | ORAL_TABLET | ORAL | Status: DC | PRN

## 2011-11-04 MED ORDER — FENTANYL CITRATE 0.05 MG/ML IJ SOLN: 25.0000 ug | INTRAMUSCULAR | Status: DC | PRN

## 2011-11-04 MED ORDER — LACTATED RINGERS IV SOLN
INTRAVENOUS | Status: DC
  Administered 2011-11-04 (×3): via INTRAVENOUS

## 2011-11-04 MED ORDER — ALPRAZOLAM 0.5 MG PO TABS: 0.5000 mg | ORAL_TABLET | Freq: Every evening | ORAL | Status: DC | PRN

## 2011-11-04 MED ORDER — ROCURONIUM BROMIDE 50 MG/5ML IV SOLN
INTRAVENOUS | Status: AC
  Filled 2011-11-04: qty 1

## 2011-11-04 MED ORDER — BUPIVACAINE HCL (PF) 0.25 % IJ SOLN
INTRAMUSCULAR | Status: AC
  Filled 2011-11-04: qty 30

## 2011-11-04 MED ORDER — DEXAMETHASONE SODIUM PHOSPHATE 4 MG/ML IJ SOLN
INTRAMUSCULAR | Status: DC | PRN
  Administered 2011-11-04: 8 mg via INTRAVENOUS

## 2011-11-04 MED ORDER — ACETAMINOPHEN 325 MG PO TABS: 325.0000 mg | ORAL_TABLET | ORAL | Status: DC | PRN

## 2011-11-04 MED ORDER — NEOSTIGMINE METHYLSULFATE 1 MG/ML IJ SOLN
INTRAMUSCULAR | Status: AC
  Filled 2011-11-04: qty 10

## 2011-11-04 MED ORDER — LIDOCAINE HCL (PF) 1 % IJ SOLN
INTRAMUSCULAR | Status: AC
  Filled 2011-11-04: qty 30

## 2011-11-04 MED ORDER — KETOROLAC TROMETHAMINE 30 MG/ML IJ SOLN: 15.0000 mg | Freq: Once | INTRAMUSCULAR | Status: DC | PRN

## 2011-11-04 MED ORDER — PROPOFOL 10 MG/ML IV EMUL
INTRAVENOUS | Status: AC
  Filled 2011-11-04: qty 40

## 2011-11-04 MED ORDER — KETOROLAC TROMETHAMINE 30 MG/ML IJ SOLN
INTRAMUSCULAR | Status: DC | PRN
  Administered 2011-11-04: 30 mg via INTRAVENOUS

## 2011-11-04 MED ORDER — ONDANSETRON HCL 4 MG/2ML IJ SOLN
INTRAMUSCULAR | Status: DC | PRN
  Administered 2011-11-04: 4 mg via INTRAVENOUS

## 2011-11-04 MED ORDER — PROMETHAZINE HCL 25 MG/ML IJ SOLN
INTRAMUSCULAR | Status: AC
  Filled 2011-11-04: qty 1

## 2011-11-04 SURGICAL SUPPLY — 74 items
ADH SKN CLS APL DERMABOND .7 (GAUZE/BANDAGES/DRESSINGS) ×8
BAG URINE DRAINAGE (UROLOGICAL SUPPLIES) ×3 IMPLANT
BARRIER ADHS 3X4 INTERCEED (GAUZE/BANDAGES/DRESSINGS) ×3 IMPLANT
BLADELESS LONG 8MM (BLADE) IMPLANT
BRR ADH 4X3 ABS CNTRL BYND (GAUZE/BANDAGES/DRESSINGS) ×2
CABLE HIGH FREQUENCY MONO STRZ (ELECTRODE) ×3 IMPLANT
CATH FOLEY 3WAY  5CC 16FR (CATHETERS) ×1
CATH FOLEY 3WAY 5CC 16FR (CATHETERS) ×2 IMPLANT
CHLORAPREP W/TINT 26ML (MISCELLANEOUS) ×3 IMPLANT
CLOTH BEACON ORANGE TIMEOUT ST (SAFETY) ×3 IMPLANT
CONT PATH 16OZ SNAP LID 3702 (MISCELLANEOUS) ×3 IMPLANT
COVER MAYO STAND STRL (DRAPES) ×3 IMPLANT
COVER TABLE BACK 60X90 (DRAPES) ×6 IMPLANT
COVER TIP SHEARS 8 DVNC (MISCELLANEOUS) ×2 IMPLANT
COVER TIP SHEARS 8MM DA VINCI (MISCELLANEOUS) ×1
DECANTER SPIKE VIAL GLASS SM (MISCELLANEOUS) ×3 IMPLANT
DERMABOND ADVANCED (GAUZE/BANDAGES/DRESSINGS) ×4
DERMABOND ADVANCED .7 DNX12 (GAUZE/BANDAGES/DRESSINGS) ×5 IMPLANT
DRAPE HUG U DISPOSABLE (DRAPE) ×3 IMPLANT
DRAPE LG THREE QUARTER DISP (DRAPES) ×6 IMPLANT
DRAPE MONITOR DA VINCI (DRAPE) IMPLANT
DRAPE WARM FLUID 44X44 (DRAPE) ×3 IMPLANT
ELECT REM PT RETURN 9FT ADLT (ELECTROSURGICAL) ×3
ELECTRODE REM PT RTRN 9FT ADLT (ELECTROSURGICAL) ×2 IMPLANT
EVACUATOR SMOKE 8.L (FILTER) ×3 IMPLANT
GAUZE VASELINE 3X9 (GAUZE/BANDAGES/DRESSINGS) IMPLANT
GLOVE BIO SURGEON STRL SZ7.5 (GLOVE) ×9 IMPLANT
GOWN PREVENTION PLUS XLARGE (GOWN DISPOSABLE) ×3 IMPLANT
GOWN STRL REIN XL XLG (GOWN DISPOSABLE) ×18 IMPLANT
GYRUS RUMI II 2.5CM BLUE (DISPOSABLE)
GYRUS RUMI II 3.5CM BLUE (DISPOSABLE)
GYRUS RUMI II 4.0CM BLUE (DISPOSABLE)
KIT ACCESSORY DA VINCI DISP (KITS) ×1
KIT ACCESSORY DVNC DISP (KITS) ×2 IMPLANT
KIT DISP ACCESSORY 4 ARM (KITS) IMPLANT
LEGGING LITHOTOMY PAIR STRL (DRAPES) ×2 IMPLANT
NDL INSUFFLATION 14GA 120MM (NEEDLE) ×1 IMPLANT
NDL INSUFFLATION 14GA 150MM (NEEDLE) IMPLANT
NEEDLE INSUFFLATION 14GA 120MM (NEEDLE) ×3 IMPLANT
NEEDLE INSUFFLATION 14GA 150MM (NEEDLE) ×3 IMPLANT
PACK LAVH (CUSTOM PROCEDURE TRAY) ×3 IMPLANT
PAD PREP 24X48 CUFFED NSTRL (MISCELLANEOUS) ×6 IMPLANT
PLUG CATH AND CAP STER (CATHETERS) ×3 IMPLANT
POSITIONER SURGICAL ARM (MISCELLANEOUS) ×6 IMPLANT
RUMI II 3.0CM BLUE KOH-EFFICIE (DISPOSABLE) ×2 IMPLANT
RUMI II GYRUS 2.5CM BLUE (DISPOSABLE) IMPLANT
RUMI II GYRUS 3.5CM BLUE (DISPOSABLE) IMPLANT
RUMI II GYRUS 4.0CM BLUE (DISPOSABLE) IMPLANT
SCISSORS LAP 5X35 DISP (ENDOMECHANICALS) ×2 IMPLANT
SET IRRIG TUBING LAPAROSCOPIC (IRRIGATION / IRRIGATOR) ×3 IMPLANT
SOLUTION ELECTROLUBE (MISCELLANEOUS) ×3 IMPLANT
SPONGE LAP 18X18 X RAY DECT (DISPOSABLE) IMPLANT
SUT VIC AB 0 CT1 27 (SUTURE) ×12
SUT VIC AB 0 CT1 27XBRD ANBCTR (SUTURE) ×4 IMPLANT
SUT VIC AB 0 CT1 27XBRD ANTBC (SUTURE) ×2 IMPLANT
SUT VICRYL 0 UR6 27IN ABS (SUTURE) ×7 IMPLANT
SUT VICRYL RAPIDE 4/0 PS 2 (SUTURE) ×4 IMPLANT
SYR 50ML LL SCALE MARK (SYRINGE) ×3 IMPLANT
SYRINGE 10CC LL (SYRINGE) ×3 IMPLANT
SYSTEM CONVERTIBLE TROCAR (TROCAR) ×2 IMPLANT
TIP UTERINE 5.1X6CM LAV DISP (MISCELLANEOUS) IMPLANT
TIP UTERINE 6.7X10CM GRN DISP (MISCELLANEOUS) ×2 IMPLANT
TIP UTERINE 6.7X6CM WHT DISP (MISCELLANEOUS) IMPLANT
TIP UTERINE 6.7X8CM BLUE DISP (MISCELLANEOUS) IMPLANT
TOWEL OR 17X24 6PK STRL BLUE (TOWEL DISPOSABLE) ×9 IMPLANT
TROCAR DISP BLADELESS 8 DVNC (TROCAR) ×2 IMPLANT
TROCAR DISP BLADELESS 8MM (TROCAR) ×1
TROCAR XCEL 12X100 BLDLESS (ENDOMECHANICALS) IMPLANT
TROCAR XCEL NON-BLD 5MMX100MML (ENDOMECHANICALS) ×5 IMPLANT
TROCAR Z-THREAD 12X150 (TROCAR) ×5 IMPLANT
TROCAR Z-THREAD FIOS 12X100MM (TROCAR) IMPLANT
TUBING FILTER THERMOFLATOR (ELECTROSURGICAL) ×3 IMPLANT
WARMER LAPAROSCOPE (MISCELLANEOUS) ×3 IMPLANT
WATER STERILE IRR 1000ML POUR (IV SOLUTION) ×9 IMPLANT

## 2011-11-04 NOTE — Anesthesia Postprocedure Evaluation (Signed)
Anesthesia Post Note  Patient: Kathryn [name]  Procedure(s) Performed:  ROBOTIC ASSISTED TOTAL HYSTERECTOMY - With Bilateral Salpingectomies  Anesthesia type: GA  Patient location: PACU  Post pain: Pain level controlled  Post assessment: Post-op Vital signs reviewed  Last Vitals:  Filed Vitals:   11/04/11 1123  BP: 124/82  Pulse: 86  Temp: 36.7 C  Resp: 18    Post vital signs: Reviewed  Level of consciousness: sedated  Complications: No apparent anesthesia complications

## 2011-11-04 NOTE — Progress Notes (Signed)
Pt arousable to verbal stimulus, but continues to be very lethargic.  C/o pain 5/10 described as cramping.  Emotional support given and warm blanket placed on abdomen.  No pain meds given at this time secondary to LOC.

## 2011-11-04 NOTE — Anesthesia Preprocedure Evaluation (Signed)
Anesthesia Evaluation  Patient identified by MRN, date of birth, ID band Patient awake    Reviewed: Allergy & Precautions, H&P , Patient's Chart, lab work & pertinent test results, reviewed documented beta blocker date and time   History of Anesthesia Complications (+) PONV  Airway Mallampati: II TM Distance: >3 FB Neck ROM: full    Dental No notable dental hx.    Pulmonary neg pulmonary ROS,  clear to auscultation  Pulmonary exam normal       Cardiovascular Exercise Tolerance: Good neg cardio ROS regular Normal    Neuro/Psych Negative Neurological ROS  Negative Psych ROS   GI/Hepatic negative GI ROS, Neg liver ROS,   Endo/Other  Negative Endocrine ROS  Renal/GU negative Renal ROS     Musculoskeletal   Abdominal   Peds  Hematology negative hematology ROS (+)   Anesthesia Other Findings   Reproductive/Obstetrics negative OB ROS                           Anesthesia Physical Anesthesia Plan  ASA: II  Anesthesia Plan: General ETT   Post-op Pain Management:    Induction:   Airway Management Planned:   Additional Equipment:   Intra-op Plan:   Post-operative Plan:   Informed Consent: I have reviewed the patients History and Physical, chart, labs and discussed the procedure including the risks, benefits and alternatives for the proposed anesthesia with the patient or authorized representative who has indicated his/her understanding and acceptance.   Dental Advisory Given  Plan Discussed with: CRNA and Surgeon  Anesthesia Plan Comments:         Anesthesia Quick Evaluation

## 2011-11-04 NOTE — Op Note (Signed)
11/04/2011  3:39 PM  PATIENT:  Kathryn [name]  31 y.o. female  PRE-OPERATIVE DIAGNOSIS:  Menorrhagia  POST-OPERATIVE DIAGNOSIS:  Menorrhagia  PROCEDURE:  Procedure(s): ROBOTIC ASSISTED TOTAL HYSTERECTOMY BILATERAL SALPINGECTOMY LYSIS OF ADHESIONS.  SURGEON:  Surgeon(s): Lenoard Aden, MD Serita Kyle, MD  ASSISTANTS: Cherly Hensen MD   ANESTHESIA:   local and general  ESTIMATED BLOOD LOSS:   DRAINS: Urinary Catheter (Foley)   LOCAL MEDICATIONS USED:  MARCAINE 10CC  SPECIMEN:  Source of Specimen:  uterus , cervix and both tubes  DISPOSITION OF SPECIMEN:  PATHOLOGY  COUNTS:  YES  DICTATION #: [id number]  PLAN OF CARE: 23 hour observatin  PATIENT DISPOSITION:  PACU - hemodynamically stable.

## 2011-11-04 NOTE — Transfer of Care (Signed)
Immediate Anesthesia Transfer of Care Note  Patient: Kathryn [name]  Procedure(s) Performed:  ROBOTIC ASSISTED TOTAL HYSTERECTOMY - With Bilateral Salpingectomies  Patient Location: PACU  Anesthesia Type: General  Level of Consciousness: sedated  Airway & Oxygen Therapy: Patient Spontanous Breathing and Patient connected to nasal cannula oxygen  Post-op Assessment: Report given to PACU RN  Post vital signs: Reviewed and stable  Complications: No apparent anesthesia complications

## 2011-11-04 NOTE — Progress Notes (Signed)
Patient ID: Kathryn [name], female   DOB: 06/19/1981, 31 y.o.   MRN: [id number] Patient seen and examined. Consent witnessed and signed. No changes noted. Update completed.

## 2011-11-05 LAB — CBC
HCT: 37 % (ref 36.0–46.0)
MCV: 83.5 fL (ref 78.0–100.0)
RBC: 4.43 MIL/uL (ref 3.87–5.11)
WBC: 13.3 10*3/uL — ABNORMAL HIGH (ref 4.0–10.5)

## 2011-11-05 MED ORDER — OXYCODONE-ACETAMINOPHEN 5-325 MG PO TABS: 1.0000 | ORAL_TABLET | ORAL | Status: AC | PRN

## 2011-11-05 MED ORDER — TRAMADOL HCL 50 MG PO TABS: 50.0000 mg | ORAL_TABLET | Freq: Four times a day (QID) | ORAL | Status: AC | PRN

## 2011-11-05 NOTE — Progress Notes (Signed)
DC IV, reviewed discharge instructions and med rec. Gave patients both of her prescriptions. Pt stable. Pt states no questions nor concerns. Thayer Ohm RN walked with pt and family to main entrance.

## 2011-11-05 NOTE — Anesthesia Postprocedure Evaluation (Signed)
  Anesthesia Post-op Note  Patient: Kathryn [name]  Procedure(s) Performed:  ROBOTIC ASSISTED TOTAL HYSTERECTOMY - With Bilateral Salpingectomies  Patient Location: PACU and Women's Unit  Anesthesia Type: General  Level of Consciousness: awake, alert  and oriented  Airway and Oxygen Therapy: Patient Spontanous Breathing  Post-op Pain: none  Post-op Assessment: Post-op Vital signs reviewed  Post-op Vital Signs: Reviewed and stable  Complications: No apparent anesthesia complications

## 2011-11-05 NOTE — Op Note (Signed)
NAMEMORNINGSTAR, TOFT NO.:  [id number]  MEDICAL RECORD NO.:  [id number]  LOCATION:  9315                          FACILITY:  [facility]  PHYSICIAN:  Lenoard Aden, M.D.DATE OF BIRTH:  06-15-1981  DATE OF PROCEDURE:  11/04/2011 DATE OF DISCHARGE:                              OPERATIVE REPORT   DESCRIPTION OF PROCEDURE:  After being apprised of risks of anesthesia, infection, bleeding, injury to abdominal organs, need for repair, delayed versus immediate complications including bowel and bladder injury, possible need for repair, the patient was brought to the operating room and she was administered general anesthetic without complications.  She was prepped and draped in usual sterile fashion. Foley catheter was placed.  After placement of the Foley catheter, RUMI retractor was placed for vagina in a standard fashion.  Attention was turned to the abdominal portion of the procedure whereby infraumbilical incision was made with a scalpel.  Veress needle was placed, opening pressure -2, 4 L of CO2 insufflated without difficulty.  Trocar was placed.  Trocar placement revealed omental adhesions to the anterior abdominal wall in the midline from approximately 4-5 cm distal to the port placement down into the pelvis, no bowel involved.  Two 8-mm ports were placed bilaterally, 5-mm port also on the left.  Entry of Endo Shears was performed to the right port and these omental adhesions were lysed sharply close to the anterior abdominal wall.  With good hemostasis achieved in the process, therefore an extensive lysis of omental adhesions to the anterior abdominal wall was performed, then revealing clear access to the pelvis.  There were some adhesions of the bladder flap to the lower uterine segment, and otherwise bilateral normal adnexa with previously divided tubes.  The ureters were identified peristalsing normally bilaterally.  The left tube was traced out in an interrupted  fashion to the fimbriated end and the mesosalpinx was cauterized dividing the tube on the left.  The retroperitoneal space was entered and the triangle between the round ligament, the pelvic sidewall, and the ovarian ligament.  The retroperitoneal space was then dissected.  The ureter was identified.  The left ovarian ligament was cauterized and divided.  The round ligament was then cauterized and divided.  The leafs of peritoneum performing the broad ligament were dissected sharply and the uterine vessels were skeletonized on the left. The bladder flap was developed sharply and the bladder adhesions to the lower uterine segment were lysed sharply with the ability to expose the bladder flap and the cervical vaginal junction without difficulty.  The left uterine vessels were then cauterized, but not divided.  The right tube was then also divided along the mesosalpingeal area, the retroperitoneal space was entered.  The right ovarian ligament was cauterized and divided.  The right round ligament was divided and the uterine vessel was skeletonized on the right.  At this time, uterine vessels on the right were cauterized and divided.  The vessels on the left were then divided.  The cervical vaginal junction was scored using the scissors and the RUMI cup was exposed 360 degrees.  The specimen was retracted in the vagina as well as both tubes.  The vagina was closed  using 0 V-Loc suture in a continuous running fashion.  Good hemostasis was noted.  Vaginal exam revealed good coaptation of the vaginal mucosa. At this time having achieved good hemostasis, irrigation was accomplished and was assured.  The adhesions were further explored and found to be hemostatic and the robot was then undocked.  CO2 was released.  All trocars were removed under direct visualization.  Incision was closed using 0 Vicryl, 4-0 Vicryl, and Dermabond.  The patient tolerated the procedure well and was transferred to  recovery in good condition.     Lenoard Aden, M.D.     RJT/MEDQ  D:  11/04/2011  T:  11/05/2011  Job:  [id number]

## 2011-11-05 NOTE — Progress Notes (Signed)
1 Day Post-Op Procedure(s): ROBOTIC ASSISTED TOTAL HYSTERECTOMY  Subjective: Patient reports incisional pain, + flatus, + BM and no problems voiding.    Objective: I have reviewed patient's vital signs, intake and output, medications and labs.  General: alert, cooperative and appears stated age Resp: clear to auscultation bilaterally and normal percussion bilaterally Cardio: regular rate and rhythm, S1, S2 normal, no murmur, click, rub or gallop GI: soft, non-tender; bowel sounds normal; no masses,  no organomegaly and incision: clean, dry and intact Extremities: extremities normal, atraumatic, no cyanosis or edema Vaginal Bleeding: minimal  Assessment: s/p Procedure(s): ROBOTIC ASSISTED TOTAL HYSTERECTOMY: stable  Plan: Advance diet Encourage ambulation Advance to PO medication Discontinue IV fluids Discharge home  LOS: 1 day    Aero Drummonds J 11/05/2011, 8:33 AM

## 2011-11-05 NOTE — Addendum Note (Signed)
Addendum  created 11/05/11 0748 by Isabella Bowens, CRNA   Modules edited:Notes Section

## 2012-01-19 ENCOUNTER — Encounter (HOSPITAL_COMMUNITY): Payer: Self-pay | Admitting: Cardiology

## 2012-01-19 ENCOUNTER — Emergency Department (INDEPENDENT_AMBULATORY_CARE_PROVIDER_SITE_OTHER)
Admission: EM | Admit: 2012-01-19 | Discharge: 2012-01-19 | Disposition: A | Discharge status: Home / Self Care | Payer: Managed Care, Other (non HMO) | Source: Home / Self Care | Attending: Family Medicine | Admitting: Family Medicine

## 2012-01-19 DIAGNOSIS — H8309 Labyrinthitis, unspecified ear: Secondary | ICD-10-CM

## 2012-01-19 LAB — POCT I-STAT, CHEM 8
BUN: 10 mg/dL (ref 6–23)
Chloride: 107 mEq/L (ref 96–112)
Sodium: 142 mEq/L (ref 135–145)

## 2012-01-19 MED ORDER — MECLIZINE HCL 50 MG PO TABS: 50.0000 mg | ORAL_TABLET | Freq: Three times a day (TID) | ORAL | Status: AC | PRN

## 2012-01-19 NOTE — ED Provider Notes (Signed)
History     CSN: [id number]  Arrival date & time 01/19/12  1658   First MD Initiated Contact with Patient 01/19/12 1700      Chief Complaint  Patient presents with  . Muscle Pain  . Dizziness  . Headache    (Consider location/radiation/quality/duration/timing/severity/associated sxs/prior treatment) Patient is a 31 y.o. female presenting with musculoskeletal pain and headaches. The history is provided by the patient and a parent.  Muscle Pain This is a new problem. The current episode started 3 to 5 hours ago (dizziness while driving home, no n/v no visual changes. sl headache this eve. denies stress but is going thru separation .). The problem has not changed since onset.Associated symptoms include headaches. Associated symptoms comments: Denies xs caffeine or palpitations..  Headache The primary symptoms include headaches and dizziness. Primary symptoms do not include seizures.  The headache is not associated with weakness.  Dizziness does not occur with weakness.  Additional symptoms do not include weakness.    Past Medical History  Diagnosis Date  . PONV (postoperative nausea and vomiting)     cries when awakening    Past Surgical History  Procedure Date  . Cesarean section   . Arthroscopic knee 2012  . Mandible fracture surgery age 78  . Tmj arthroplasty   . Wrist fracture surgery   . Wrist ganglion excision   . Cholecystectomy   . Abdominal hysterectomy 10/2011    History reviewed. No pertinent family history.  History  Substance Use Topics  . Smoking status: Never Smoker   . Smokeless tobacco: Not on file  . Alcohol Use: No    OB History    Grav Para Term Preterm Abortions TAB SAB Ect Mult Living                  Review of Systems  Constitutional: Negative.   HENT: Negative for congestion, rhinorrhea, sneezing and postnasal drip.   Respiratory: Negative.   Cardiovascular: Negative.   Gastrointestinal: Negative.   Musculoskeletal: Negative.     Skin: Negative.   Neurological: Positive for dizziness and headaches. Negative for tremors, seizures, syncope, speech difficulty, weakness and numbness.    Allergies  Review of patient's allergies indicates no known allergies.  Home Medications   Current Outpatient Rx  Name Route Sig Dispense Refill  . ADULT MULTIVITAMIN W/MINERALS CH Oral Take 1 tablet by mouth daily.    . ACETAMINOPHEN 500 MG PO TABS Oral Take 1,500 mg by mouth daily as needed. For headache    . ALPRAZOLAM 0.5 MG PO TABS Oral Take 0.5 mg by mouth at bedtime as needed.    . MECLIZINE HCL 50 MG PO TABS Oral Take 1 tablet (50 mg total) by mouth 3 (three) times daily as needed. 15 tablet 0    BP 133/88  Pulse 76  Temp(Src) 98 F (36.7 C) (Oral)  Resp 18  SpO2 100%  LMP 10/24/2011  Physical Exam  Nursing note and vitals reviewed. Constitutional: She is oriented to person, place, and time. She appears well-developed and well-nourished.  HENT:  Head: Normocephalic.  Right Ear: External ear normal.  Left Ear: External ear normal.  Eyes: Conjunctivae and EOM are normal. Pupils are equal, round, and reactive to light.  Neck: Normal range of motion. Neck supple.  Cardiovascular: Normal rate, normal heart sounds and intact distal pulses.   Pulmonary/Chest: Effort normal and breath sounds normal.  Abdominal: Bowel sounds are normal. There is no tenderness.  Musculoskeletal: Normal range of motion.  Lymphadenopathy:    She has no cervical adenopathy.  Neurological: She is alert and oriented to person, place, and time. She has normal strength and normal reflexes. No cranial nerve deficit or sensory deficit. She displays a negative Romberg sign.  Skin: Skin is warm and dry.  Psychiatric: She has a normal mood and affect.    ED Course  Procedures (including critical care time)  Labs Reviewed  POCT I-STAT, CHEM 8 - Abnormal; Notable for the following:    Hemoglobin 15.3 (*)    All other components within normal  limits   No results found.   1. Labyrinthitis       MDM  i-stat wnl.        Linna Hoff, MD 01/19/12 2042

## 2012-01-19 NOTE — ED Notes (Signed)
Pt reports dizziness that started this afternoon. She has muscle cramps in bilat feet. Feels muscle contractions of legs and arm that are constant. Denies N/V/D. Pt was driving today had numbness of legs. Denies vision changes. Headache started this evening. Tolerating PO intake. Denies fever.

## 2012-01-19 NOTE — Discharge Instructions (Signed)
Drink plenty of water, use medicine as needed, return or see your doctor if further problems.

## 2012-01-23 ENCOUNTER — Inpatient Hospital Stay (HOSPITAL_COMMUNITY): Admission: RE | Admit: 2012-01-23 | Payer: Managed Care, Other (non HMO) | Source: Ambulatory Visit

## 2012-01-23 ENCOUNTER — Other Ambulatory Visit (HOSPITAL_COMMUNITY): Payer: Self-pay | Admitting: Internal Medicine

## 2012-01-23 ENCOUNTER — Ambulatory Visit (HOSPITAL_COMMUNITY)
Admission: RE | Admit: 2012-01-23 | Discharge: 2012-01-23 | Disposition: A | Discharge status: Home / Self Care | Payer: Managed Care, Other (non HMO) | Source: Ambulatory Visit | Attending: Internal Medicine | Admitting: Internal Medicine

## 2012-01-23 DIAGNOSIS — G459 Transient cerebral ischemic attack, unspecified: Secondary | ICD-10-CM

## 2012-01-23 DIAGNOSIS — G9389 Other specified disorders of brain: Secondary | ICD-10-CM | POA: Insufficient documentation

## 2012-01-23 DIAGNOSIS — R209 Unspecified disturbances of skin sensation: Secondary | ICD-10-CM | POA: Insufficient documentation

## 2012-01-23 DIAGNOSIS — R42 Dizziness and giddiness: Secondary | ICD-10-CM | POA: Insufficient documentation

## 2012-01-23 MED ORDER — GADOBENATE DIMEGLUMINE 529 MG/ML IV SOLN
19.0000 mL | Freq: Once | INTRAVENOUS | Status: AC | PRN
  Administered 2012-01-23: 19 mL via INTRAVENOUS

## 2012-07-25 ENCOUNTER — Ambulatory Visit (INDEPENDENT_AMBULATORY_CARE_PROVIDER_SITE_OTHER): Payer: BC Managed Care – PPO | Admitting: Family Medicine

## 2012-07-25 VITALS — BP 116/77 | HR 90 | Temp 98.5°F | Resp 16 | Ht 65.0 in | Wt 210.0 lb

## 2012-07-25 DIAGNOSIS — J069 Acute upper respiratory infection, unspecified: Secondary | ICD-10-CM

## 2012-07-25 NOTE — Progress Notes (Signed)
[facility] [address] [place] [address] [phone]  Date:  07/25/2012   Name:  Kathryn [name]   DOB:  [birth year]   MRN:  [id number]  PCP:  Gaspar Garbe, MD    Chief Complaint: Cough, Sore Throat, Lymphadenopathy, Fatigue, Insomnia, Allergic Rhinitis  and Headache   History of Present Illness:  Kathryn [name] is a 31 y.o. very pleasant female patient who presents with the following:  About 5 days ago she noted a scratchy throat, then the next morning she noted a cough.   She got sick 3 days ago.  She saw her PCP Dr. Neale Burly and she did have a CBC which was normal.  She was given a Zpack and has taken 3 days so far.  Her chest is tight, she has a runny nose, and she is fatigued and coughing up some green mucus.   No fever.   She is tender in the left side of her neck- swollen gland.  Otherwise no aches.   No GI symptoms.    She had a hysterectomy in January of this year for menorrhagia and possible endometriosis.  She has 2 daughters at home.  They are 65 and 77 years old.  No one else is sick at home.    She tried motrin a couple of days ago- no meds yet today  Patient Active Problem List  Diagnosis  . HYPERTHYROIDISM  . VITAMIN B12 DEFICIENCY  . ALLERGIC RHINITIS  . MENOPAUSAL SYNDROME    Past Medical History  Diagnosis Date  . PONV (postoperative nausea and vomiting)     cries when awakening    Past Surgical History  Procedure Date  . Cesarean section   . Arthroscopic knee 2012  . Mandible fracture surgery age 1  . Tmj arthroplasty   . Wrist fracture surgery   . Wrist ganglion excision   . Cholecystectomy   . Abdominal hysterectomy 10/2011    History  Substance Use Topics  . Smoking status: Never Smoker   . Smokeless tobacco: Not on file  . Alcohol Use: No    No family history on file.  No Known Allergies  Medication list has been reviewed and updated.  Current Outpatient Prescriptions on File Prior to Visit    Medication Sig Dispense Refill  . Multiple Vitamin (MULITIVITAMIN WITH MINERALS) TABS Take 1 tablet by mouth daily.      Marland Kitchen acetaminophen (TYLENOL) 500 MG tablet Take 1,500 mg by mouth daily as needed. For headache      . ALPRAZolam (XANAX) 0.5 MG tablet Take 0.5 mg by mouth at bedtime as needed.        Review of Systems:  As per HPI- otherwise negative.   Physical Examination: Filed Vitals:   07/25/12 0828  BP: 116/77  Pulse: 90  Temp: 98.5 F (36.9 C)  Resp: 16   Filed Vitals:   07/25/12 0828  Height: 5\' 5"  (1.651 m)  Weight: 210 lb (95.255 kg)   Body mass index is 34.95 kg/(m^2). Ideal Body Weight: Weight in (lb) to have BMI = 25: 149.9   GEN: WDWN, NAD, Non-toxic, A & O x 3, obese HEENT: Atraumatic, Normocephalic. Neck supple. No masses, No LAD.  Bilateral TM wnl, oropharynx normal.  PEERL,EOMI.  Nasal cavity normal Ears and Nose: No external deformity. CV: RRR, No M/G/R. No JVD. No thrill. No extra heart sounds. PULM: CTA B, no wheezes, crackles, rhonchi. No retractions. No resp. distress. No accessory muscle  use. ABD: S, NT, ND EXTR: No c/c/e NEURO Normal gait.  PSYCH: Normally interactive. Conversant. Not depressed or anxious appearing.  Calm demeanor.    Assessment and Plan: 1. URI (upper respiratory infection)    Suspect that Chalon has a viral URI. Offered to recheck her CBC but she declined for now.  Will finish out her zpack as she has taken 3 days and it may be helpful.  If she is not better in the next few days please give me a call- work on fluids, rest and motrin as needed for symptoms.    Abbe Amsterdam, MD

## 2013-03-02 ENCOUNTER — Encounter (HOSPITAL_BASED_OUTPATIENT_CLINIC_OR_DEPARTMENT_OTHER): Payer: Self-pay | Admitting: *Deleted

## 2013-03-02 ENCOUNTER — Emergency Department (HOSPITAL_BASED_OUTPATIENT_CLINIC_OR_DEPARTMENT_OTHER)
Admission: EM | Admit: 2013-03-02 | Discharge: 2013-03-03 | Disposition: A | Discharge status: Home / Self Care | Payer: BC Managed Care – PPO | Attending: Emergency Medicine | Admitting: Emergency Medicine

## 2013-03-02 ENCOUNTER — Emergency Department (HOSPITAL_BASED_OUTPATIENT_CLINIC_OR_DEPARTMENT_OTHER): Payer: BC Managed Care – PPO

## 2013-03-02 DIAGNOSIS — M2550 Pain in unspecified joint: Secondary | ICD-10-CM | POA: Insufficient documentation

## 2013-03-02 DIAGNOSIS — R5381 Other malaise: Secondary | ICD-10-CM | POA: Insufficient documentation

## 2013-03-02 DIAGNOSIS — IMO0001 Reserved for inherently not codable concepts without codable children: Secondary | ICD-10-CM | POA: Insufficient documentation

## 2013-03-02 DIAGNOSIS — Z79899 Other long term (current) drug therapy: Secondary | ICD-10-CM | POA: Insufficient documentation

## 2013-03-02 DIAGNOSIS — K921 Melena: Secondary | ICD-10-CM | POA: Insufficient documentation

## 2013-03-02 DIAGNOSIS — K625 Hemorrhage of anus and rectum: Secondary | ICD-10-CM | POA: Insufficient documentation

## 2013-03-02 DIAGNOSIS — R5383 Other fatigue: Secondary | ICD-10-CM | POA: Insufficient documentation

## 2013-03-02 DIAGNOSIS — R1084 Generalized abdominal pain: Secondary | ICD-10-CM | POA: Insufficient documentation

## 2013-03-02 DIAGNOSIS — R197 Diarrhea, unspecified: Secondary | ICD-10-CM

## 2013-03-02 DIAGNOSIS — R110 Nausea: Secondary | ICD-10-CM | POA: Insufficient documentation

## 2013-03-02 DIAGNOSIS — Z3202 Encounter for pregnancy test, result negative: Secondary | ICD-10-CM | POA: Insufficient documentation

## 2013-03-02 DIAGNOSIS — Z792 Long term (current) use of antibiotics: Secondary | ICD-10-CM | POA: Insufficient documentation

## 2013-03-02 DIAGNOSIS — R42 Dizziness and giddiness: Secondary | ICD-10-CM | POA: Insufficient documentation

## 2013-03-02 LAB — CBC WITH DIFFERENTIAL/PLATELET
Basophils Absolute: 0.1 10*3/uL (ref 0.0–0.1)
HCT: 44.3 % (ref 36.0–46.0)
Hemoglobin: 14.7 g/dL (ref 12.0–15.0)
Lymphocytes Relative: 32 % (ref 12–46)
Lymphs Abs: 2.7 10*3/uL (ref 0.7–4.0)
Monocytes Absolute: 0.7 10*3/uL (ref 0.1–1.0)
Monocytes Relative: 8 % (ref 3–12)
Neutro Abs: 4.6 10*3/uL (ref 1.7–7.7)
WBC: 8.4 10*3/uL (ref 4.0–10.5)

## 2013-03-02 LAB — URINALYSIS, ROUTINE W REFLEX MICROSCOPIC
Bilirubin Urine: NEGATIVE
Ketones, ur: NEGATIVE mg/dL
Leukocytes, UA: NEGATIVE
Nitrite: NEGATIVE
Specific Gravity, Urine: 1.029 (ref 1.005–1.030)
Urobilinogen, UA: 0.2 mg/dL (ref 0.0–1.0)
pH: 5.5 (ref 5.0–8.0)

## 2013-03-02 LAB — COMPREHENSIVE METABOLIC PANEL
AST: 38 U/L — ABNORMAL HIGH (ref 0–37)
BUN: 11 mg/dL (ref 6–23)
CO2: 25 mEq/L (ref 19–32)
Chloride: 104 mEq/L (ref 96–112)
Creatinine, Ser: 1 mg/dL (ref 0.50–1.10)
GFR calc non Af Amer: 73 mL/min — ABNORMAL LOW (ref >90)
Total Bilirubin: 0.4 mg/dL (ref 0.3–1.2)

## 2013-03-02 LAB — LACTIC ACID, PLASMA: Lactic Acid, Venous: 1.4 mmol/L (ref 0.5–2.2)

## 2013-03-02 LAB — OCCULT BLOOD X 1 CARD TO LAB, STOOL: Fecal Occult Bld: POSITIVE — AB

## 2013-03-02 MED ORDER — ONDANSETRON HCL 4 MG/2ML IJ SOLN
4.0000 mg | Freq: Once | INTRAMUSCULAR | Status: AC
  Administered 2013-03-02: 4 mg via INTRAVENOUS
  Filled 2013-03-02: qty 2

## 2013-03-02 MED ORDER — PROMETHAZINE HCL 25 MG/ML IJ SOLN
25.0000 mg | Freq: Once | INTRAMUSCULAR | Status: DC
  Filled 2013-03-02: qty 1

## 2013-03-02 MED ORDER — IOHEXOL 300 MG/ML  SOLN
50.0000 mL | Freq: Once | INTRAMUSCULAR | Status: AC | PRN
  Administered 2013-03-02: 50 mL via ORAL

## 2013-03-02 MED ORDER — SODIUM CHLORIDE 0.9 % IV BOLUS (SEPSIS)
1000.0000 mL | Freq: Once | INTRAVENOUS | Status: AC
  Administered 2013-03-02: 1000 mL via INTRAVENOUS

## 2013-03-02 MED ORDER — IOHEXOL 300 MG/ML  SOLN
100.0000 mL | Freq: Once | INTRAMUSCULAR | Status: AC | PRN
  Administered 2013-03-02: 100 mL via INTRAVENOUS

## 2013-03-02 NOTE — ED Notes (Signed)
Patient transported to CT 

## 2013-03-02 NOTE — ED Notes (Signed)
Pt reports diarrhea and rectal bleeding  X 3 days

## 2013-03-02 NOTE — ED Provider Notes (Signed)
History     CSN: [id number]  Arrival date & time 03/02/13  2024   First MD Initiated Contact with Patient 03/02/13 2212      Chief Complaint  Patient presents with  . Diarrhea  . Rectal Bleeding    (Consider location/radiation/quality/duration/timing/severity/associated sxs/prior treatment) HPI Comments: Patient presents with a 2-1/2 day history of loose stools that have become bloody today and yesterday. She reports generalized weakness fatigue. No vomiting, fever, chills, or vaginal symptoms. She admits to eating raw cookie that 2 nights ago and symptoms seemed to start after that. She said frequent nonbloody stools started yesterday with 2 episodes of bloody stools yesterday. The water turned red and she had read with wiping. One episode of bloody stool today and red with wiping. Denies any chest pain, shortness of breath, cough or fever. Denies any recent travel denies any recent antibiotic use. Denies any family history of inflammatory bowel disease.  The history is provided by the patient.    Past Medical History  Diagnosis Date  . PONV (postoperative nausea and vomiting)     cries when awakening    Past Surgical History  Procedure Laterality Date  . Cesarean section    . Arthroscopic knee  2012  . Mandible fracture surgery  age 31  . Tmj arthroplasty    . Wrist fracture surgery    . Wrist ganglion excision    . Cholecystectomy    . Abdominal hysterectomy  10/2011    History reviewed. No pertinent family history.  History  Substance Use Topics  . Smoking status: Never Smoker   . Smokeless tobacco: Not on file  . Alcohol Use: No    OB History   Grav Para Term Preterm Abortions TAB SAB Ect Mult Living                  Review of Systems  Constitutional: Positive for activity change, appetite change and fatigue. Negative for fever.  HENT: Negative for congestion and rhinorrhea.   Respiratory: Negative for cough, chest tightness and shortness of breath.    Cardiovascular: Negative for chest pain.  Gastrointestinal: Positive for nausea, abdominal pain, diarrhea and hematochezia. Negative for vomiting.  Genitourinary: Negative for dysuria and hematuria.  Musculoskeletal: Positive for myalgias and arthralgias.  Skin: Negative for rash.  Neurological: Positive for dizziness, weakness and light-headedness.  A complete 10 system review of systems was obtained and all systems are negative except as noted in the HPI and PMH.    Allergies  Review of patient's allergies indicates no known allergies.  Home Medications   Current Outpatient Rx  Name  Route  Sig  Dispense  Refill  . acetaminophen (TYLENOL) 500 MG tablet   Oral   Take 1,500 mg by mouth daily as needed. For headache         . ALPRAZolam (XANAX) 0.5 MG tablet   Oral   Take 0.5 mg by mouth at bedtime as needed.         Marland Kitchen azithromycin (ZITHROMAX) 250 MG tablet   Oral   Take 500 mg by mouth daily.         . ciprofloxacin (CIPRO) 500 MG tablet   Oral   Take 1 tablet (500 mg total) by mouth every 12 (twelve) hours.   20 tablet   0   . HYDROcodone-acetaminophen (NORCO/VICODIN) 5-325 MG per tablet   Oral   Take 2 tablets by mouth every 4 (four) hours as needed for pain.   10 tablet  0   . metroNIDAZOLE (FLAGYL) 500 MG tablet   Oral   Take 1 tablet (500 mg total) by mouth 2 (two) times daily.   20 tablet   0   . Multiple Vitamin (MULITIVITAMIN WITH MINERALS) TABS   Oral   Take 1 tablet by mouth daily.         . ondansetron (ZOFRAN) 4 MG tablet   Oral   Take 1 tablet (4 mg total) by mouth every 6 (six) hours.   12 tablet   0     BP 131/92  Pulse 87  Temp(Src) 98.8 F (37.1 C)  Resp 16  Ht 5\' 4"  (1.626 m)  Wt 220 lb (99.791 kg)  BMI 37.74 kg/m2  SpO2 100%  LMP 10/24/2011  Physical Exam  Constitutional: She is oriented to person, place, and time. She appears well-developed and well-nourished. No distress.  HENT:  Head: Normocephalic and  atraumatic.  Mouth/Throat: Oropharynx is clear and moist. No oropharyngeal exudate.  Moist mucus membranes  Eyes: Conjunctivae and EOM are normal. Pupils are equal, round, and reactive to light.  Neck: Normal range of motion. Neck supple.  Cardiovascular: Normal rate, regular rhythm and normal heart sounds.   No murmur heard. Pulmonary/Chest: Effort normal and breath sounds normal. No respiratory distress.  Abdominal: Soft. There is tenderness. There is no rebound and no guarding.  Diffuse abdominal tenderness without peritoneal signs  Genitourinary: Guaiac positive stool.  Small external hemorrhoids, nonbleeding. Pink-colored mucus on rectal exam  Musculoskeletal: Normal range of motion. She exhibits no edema and no tenderness.  Neurological: She is alert and oriented to person, place, and time. No cranial nerve deficit. She exhibits normal muscle tone. Coordination normal.  Skin: Skin is warm.    ED Course  Procedures (including critical care time)  Labs Reviewed  OCCULT BLOOD X 1 CARD TO LAB, STOOL - Abnormal; Notable for the following:    Fecal Occult Bld POSITIVE (*)    All other components within normal limits  CBC WITH DIFFERENTIAL - Abnormal; Notable for the following:    RBC 5.18 (*)    All other components within normal limits  COMPREHENSIVE METABOLIC PANEL - Abnormal; Notable for the following:    AST 38 (*)    ALT 49 (*)    GFR calc non Af Amer 73 (*)    GFR calc Af Amer 84 (*)    All other components within normal limits  URINALYSIS, ROUTINE W REFLEX MICROSCOPIC - Abnormal; Notable for the following:    APPearance CLOUDY (*)    All other components within normal limits  LIPASE, BLOOD  LACTIC ACID, PLASMA  PROTIME-INR  PREGNANCY, URINE   Ct Abdomen Pelvis W Contrast  03/03/2013   *RADIOLOGY REPORT*  Clinical Data: Diarrhea, rectal bleeding  CT ABDOMEN AND PELVIS WITH CONTRAST  Technique:  Multidetector CT imaging of the abdomen and pelvis was performed following  the standard protocol during bolus administration of intravenous contrast.  Contrast: 50mL OMNIPAQUE IOHEXOL 300 MG/ML  SOLN, OMNIPAQUE IOHEXOL 300 MG/ML  SOLN  Comparison: MRI abdomen 07/10/2008  Findings: Lung bases are clear.  No pericardial fluid.  On the anterior margin of the left hepatic lobe there is a 3.7 x 3.5 cm enhancing lesion which is decreased in size from 5.6 x 5.6 cm on prior.   Low attenuation of the liver suggests hepatic steatosis.  Post cholecystectomy.  The pancreas, spleen, adrenal glands, kidneys are normal.  The  stomach, small bowel, appendix, and cecum  appear without evidence obstruction.  There is some mild mucosal thickening within the distal small bowel within the pelvis (image 80).  The colon and rectosigmoid colon are normal.  Abdominal aorta is normal caliber.  No retroperitoneal periportal lymphadenopathy.  No free fluid the pelvis.  The uterus and ovaries are normal.  The bladder is normal.  No pelvic lymphadenopathy.  There are bilateral pars defects at L5 with 3 mm anterolisthesis.  IMPRESSION:  1.  Mild mucosal thickening within the ileum is nonspecific finding and could represent enteritis. 2.  Normal appendix. 3.  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 5.  Hepatic steatosis. 6.  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   Original Report Authenticated By: Genevive Bi, M.D.     1. Bloody diarrhea       MDM  Abdominal pain and rectal bleeding and bloody stools after eating raw cookie dough. Vitals stable, abdomen soft and nonsurgical.  Minimal elevation of LFTs, no leukocytosis. Orthostatics negative. Hemoglobin stable, actually improved with probable hemoconcentration.  no evidence of active bleeding. No ketones in urine. Given IVF, antiemetics in ED.  No vomiting.  Concern for infectious enteritis, possibly salmonella.  Doubt ischemic  colitis. Empiric cipro and flagyl given. PCP followup this week. Return precautions given.      Glynn Octave, MD 03/03/13 [phone]

## 2013-03-03 MED ORDER — HYDROCODONE-ACETAMINOPHEN 5-325 MG PO TABS: 2.0000 | ORAL_TABLET | ORAL | Status: DC | PRN

## 2013-03-03 MED ORDER — METRONIDAZOLE 500 MG PO TABS: 500.0000 mg | ORAL_TABLET | Freq: Two times a day (BID) | ORAL | Status: DC

## 2013-03-03 MED ORDER — CIPROFLOXACIN HCL 500 MG PO TABS: 500.0000 mg | ORAL_TABLET | Freq: Two times a day (BID) | ORAL | Status: DC

## 2013-03-03 MED ORDER — ONDANSETRON HCL 4 MG PO TABS: 4.0000 mg | ORAL_TABLET | Freq: Four times a day (QID) | ORAL | Status: DC

## 2013-07-18 ENCOUNTER — Ambulatory Visit: Payer: Self-pay | Admitting: Physician Assistant

## 2013-07-18 ENCOUNTER — Ambulatory Visit: Payer: Self-pay

## 2013-07-18 VITALS — BP 122/80 | HR 91 | Temp 98.2°F | Resp 16 | Ht 65.5 in | Wt 238.8 lb

## 2013-07-18 DIAGNOSIS — J40 Bronchitis, not specified as acute or chronic: Secondary | ICD-10-CM

## 2013-07-18 DIAGNOSIS — R05 Cough: Secondary | ICD-10-CM

## 2013-07-18 DIAGNOSIS — R030 Elevated blood-pressure reading, without diagnosis of hypertension: Secondary | ICD-10-CM

## 2013-07-18 DIAGNOSIS — J9801 Acute bronchospasm: Secondary | ICD-10-CM

## 2013-07-18 MED ORDER — HYDROCODONE-HOMATROPINE 5-1.5 MG/5ML PO SYRP: ORAL_SOLUTION | ORAL | Status: DC

## 2013-07-18 MED ORDER — ALBUTEROL SULFATE HFA 108 (90 BASE) MCG/ACT IN AERS: 2.0000 | INHALATION_SPRAY | RESPIRATORY_TRACT | Status: DC | PRN

## 2013-07-18 MED ORDER — BENZONATATE 100 MG PO CAPS: 100.0000 mg | ORAL_CAPSULE | Freq: Three times a day (TID) | ORAL | Status: DC | PRN

## 2013-07-18 MED ORDER — AZITHROMYCIN 250 MG PO TABS: ORAL_TABLET | ORAL | Status: DC

## 2013-07-18 NOTE — Progress Notes (Signed)
Patient ID: Kathryn [name] MRN: [id number], DOB: Mar 05, 1981, 32 y.o. Date of Encounter: 07/18/2013, 9:25 AM  Primary Physician: Gaspar Garbe, MD  Chief Complaint:  Chief Complaint  Patient presents with  . Cough    X 2 weeks  . Sinus Congestion    X 1 week    HPI: 32 y.o. female presents with a 2 week history of nasal congestion, post nasal drip, sore throat, and cough. She was initially seen at a [facility] and treated with Ceritussin. States her above symptoms did improve, however they did worsen and are now focused more so in her chest. Now with mild sinus pressure. Afebrile. Som chills. Nasal congestion thick and green/yellow. Cough is productive of green/yellow sputum and worse in the morning and nighttime. Some SOB and wheezing. Chest feels tight. States she has a sore spot right upper sternal border. Has tried OTC cold preps and Cheritussin without success. No GI complaints. Appetite and energy decreased. Both of her young daughters were sick with similar illnesses prior to her getting sick. No recent antibiotics or recent travels. No leg trauma, sedentary periods, h/o cancer, or tobacco use. History of PNA a couple of years ago. This feels similar to that illness. Feels like she has either PNA or bronchitis.   Past Medical History  Diagnosis Date  . PONV (postoperative nausea and vomiting)     cries when awakening     Home Meds: Prior to Admission medications   Medication Sig Start Date End Date Taking? Authorizing Provider                                Allergies: No Known Allergies  History   Social History  . Marital Status: Married    Spouse Name: N/A    Number of Children: N/A  . Years of Education: N/A   Occupational History  . Not on file.   Social History Main Topics  . Smoking status: Never Smoker   . Smokeless tobacco: Not on file  . Alcohol Use: No  . Drug Use: No  . Sexual Activity: Yes    Birth Control/ Protection: None   Other  Topics Concern  . Not on file   Social History Narrative  . No narrative on file     Review of Systems: Constitutional: positive for chills and fatigue. negative for fever HEENT: see above Cardiovascular: negative for chest pain or palpitations Respiratory: positive for cough, SOB, and wheezing Abdominal: negative for abdominal pain, nausea, vomiting or diarrhea Dermatological: negative for rash Neurologic: negative for headache   Physical Exam: Blood pressure 122/80, pulse 91, temperature 98.2 F (36.8 C), temperature source Oral, resp. rate 16, height 5' 5.5" (1.664 m), weight 238 lb 12.8 oz (108.319 kg), last menstrual period 10/24/2011, SpO2 99.00%., Body mass index is 39.12 kg/(m^2). General: Well developed, well nourished, in no acute distress. Head: Normocephalic, atraumatic, eyes without discharge, sclera non-icteric, nares are congested. Bilateral auditory canals clear, TM's are without perforation, pearly grey with reflective cone of light bilaterally. No sinus TTP. Oral cavity moist, dentition normal. Posterior pharynx with post nasal drip and mild erythema. No peritonsillar abscess or tonsillar exudate. Neck: Supple. No thyromegaly. Full ROM. No lymphadenopathy. Lungs: Coarse breath sounds bilaterally without wheezes, rales, or rhonchi. Breathing is unlabored.  Heart: RRR with S1 S2. No murmurs, rubs, or gallops appreciated. Msk:  Strength and tone normal for age. Extremities: No clubbing or cyanosis.  No edema. Neuro: Alert and oriented X 3. Moves all extremities spontaneously. CNII-XII grossly in tact. Psych:  Responds to questions appropriately with a normal affect.   [facility] reading (PRIMARY) by  Dr. Milus Glazier.  CXR: Elevated right hemidiaphragm. Mildly increased markings consistent with bronchitis. Otherwise negative.   Labs: Unable to obtain specimen.    ASSESSMENT AND PLAN:  32 y.o. female with bronchitis, RAD, cough, and elevated right hemidiaphragm with  history of liver nodule.   1) Bronchitis/ RAD/Cough  -Azithromycin 250 MG #6 2 po first day then 1 po next 4 days no RF -Proventil 2 puffs inhaled q 4-6 hours prn #1 RF 1 -Hycodan #4oz 1 tsp po q 4-6 hours prn cough no RF SED -Tessalon Perles 100 mg 1 po tid prn cough #30 no RF  -Mucinex -Tylenol/Motrin prn -Rest/fluids -RTC precautions -RTC 3-5 days if no improvement  2) Elevated right hemidiaphragm/History of liver nodule -Biopsy on 12/15/2008 indicated benign appearing liver parenchyma associated with steatosis. In some areas, there were focal fibrosis as well as bile ductular proliferation. Within the bulk of the liver parenchyma, well formed portal tract elements or central veins are not seen. 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   -Patient was advised to have follow up ultrasounds yearly at that time. She had an ultrasound in 2012 measuring the solid mass at 5.3 cm. It was unchanged at this time compared to previous studies. She had a CT on 03/02/13 measuring the mass at 3.7 x 3.5 cm.  -She declines labs today, offered her CMP -Advised the LFT needs to be checked, offered labs only, states that she will have this done by PCP -Advised patient to follow up with PCP, she agrees to do so -Copies of CXR overread and most recent CT given to patient to bring to PCP  Signed, Eula Listen, PA-C [facility] [place], [place] [address] [phone] 07/18/2013 9:25 AM

## 2014-06-06 DIAGNOSIS — A498 Other bacterial infections of unspecified site: Secondary | ICD-10-CM

## 2014-06-06 HISTORY — DX: Other bacterial infections of unspecified site: A49.8

## 2014-06-18 ENCOUNTER — Encounter (HOSPITAL_COMMUNITY): Payer: Self-pay | Admitting: Emergency Medicine

## 2014-06-18 ENCOUNTER — Emergency Department (HOSPITAL_COMMUNITY)
Admission: EM | Admit: 2014-06-18 | Discharge: 2014-06-18 | Disposition: A | Discharge status: Home / Self Care | Payer: BC Managed Care – PPO | Source: Home / Self Care | Attending: Family Medicine | Admitting: Family Medicine

## 2014-06-18 DIAGNOSIS — R197 Diarrhea, unspecified: Secondary | ICD-10-CM

## 2014-06-18 DIAGNOSIS — R109 Unspecified abdominal pain: Secondary | ICD-10-CM

## 2014-06-18 MED ORDER — HYOSCYAMINE SULFATE 0.125 MG SL SUBL: 0.1250 mg | SUBLINGUAL_TABLET | Freq: Four times a day (QID) | SUBLINGUAL | Status: DC | PRN

## 2014-06-18 NOTE — ED Notes (Signed)
Pt. given stool kit and instructed to bring sample ASAP tomorrow after her BM.

## 2014-06-18 NOTE — Discharge Instructions (Signed)
Clostridium Difficile Toxin °This is a test which may be done when a patient has diarrhea that lasts for several days, or has abdominal pain, fever, and nausea after antibiotic therapy.  °This test looks for the presence of Clostridium difficile (C.diff.) toxin in a stool sample. C.diff. is a germ (bacterium) that is one of the groups of bacteria that are usually in the colon, called "normal flora." If something upsets the growth of the other normal flora, C.diff. may overgrow and disrupt the balance of bacteria in the colon. C. diff. may produce two toxins, A and B. The combination of overgrowth and toxins may cause prolonged diarrhea. The toxins may damage the lining of the colon and lead to colitis.  °While some cases of C. diff. diarrhea and colitis do not require treatment, others require specific oral antibiotic therapy. Most patients improve as the normal flora re-establishes itself, but about some may have one or more relapses, with symptoms and detectible toxin levels coming back. °PREPARATION FOR TEST °There is no special preparation for the test. A fresh stool sample is collected in a sterile container. The sample should not be mixed with urine or water. The stool should be taken to the lab within an hour. It may be refrigerated or frozen and taken to the lab as soon as possible. The container should be labeled with your name and the date and time of the stool collection.  °NORMAL FINDINGS °Negative Tissue Culture (no toxin identified) °Ranges for normal findings may vary among different laboratories and hospitals. You should always check with your doctor after having lab work or other tests done to discuss the meaning of your test results and whether your values are considered within normal limits. °MEANING OF TEST  °Your caregiver will go over the test results with you and discuss the importance and meaning of your results, as well as treatment options and the need for additional tests if  necessary. °OBTAINING THE TEST RESULTS °It is your responsibility to obtain your test results. Ask the lab or department performing the test when and how you will get your results. °Document Released: 10/15/2004 Document Revised: 12/15/2011 Document Reviewed: 08/31/2008 °ExitCare® Patient Information ©2015 [employer]. This information is not intended to replace advice given to you by your health care provider. Make sure you discuss any questions you have with your health care provider. ° °Diarrhea °Diarrhea is frequent loose and watery bowel movements. It can cause you to feel weak and dehydrated. Dehydration can cause you to become tired and thirsty, have a dry mouth, and have decreased urination that often is dark yellow. Diarrhea is a sign of another problem, most often an infection that will not last long. In most cases, diarrhea typically lasts 2-3 days. However, it can last longer if it is a sign of something more serious. It is important to treat your diarrhea as directed by your caregiver to lessen or prevent future episodes of diarrhea. °CAUSES  °Some common causes include: °· Gastrointestinal infections caused by viruses, bacteria, or parasites. °· Food poisoning or food allergies. °· Certain medicines, such as antibiotics, chemotherapy, and laxatives. °· Artificial sweeteners and fructose. °· Digestive disorders. °HOME CARE INSTRUCTIONS °· Ensure adequate fluid intake (hydration): Have 1 cup (8 oz) of fluid for each diarrhea episode. Avoid fluids that contain simple sugars or sports drinks, fruit juices, whole milk products, and sodas. Your urine should be clear or pale yellow if you are drinking enough fluids. Hydrate with an oral rehydration solution that you   can purchase at pharmacies, retail stores, and online. You can prepare an oral rehydration solution at home by mixing the following ingredients together: °¨  - tsp table salt. °¨ ¾ tsp baking soda. °¨  tsp salt substitute containing potassium  chloride. °¨ 1  tablespoons sugar. °¨ 1 L (34 oz) of water. °· Certain foods and beverages may increase the speed at which food moves through the gastrointestinal (GI) tract. These foods and beverages should be avoided and include: °¨ Caffeinated and alcoholic beverages. °¨ High-fiber foods, such as raw fruits and vegetables, nuts, seeds, and whole grain breads and cereals. °¨ Foods and beverages sweetened with sugar alcohols, such as xylitol, sorbitol, and mannitol. °· Some foods may be well tolerated and may help thicken stool including: °¨ Starchy foods, such as rice, toast, pasta, low-sugar cereal, oatmeal, grits, baked potatoes, crackers, and bagels. °¨ Bananas. °¨ Applesauce. °· Add probiotic-rich foods to help increase healthy bacteria in the GI tract, such as yogurt and fermented milk products. °· Wash your hands well after each diarrhea episode. °· Only take over-the-counter or prescription medicines as directed by your caregiver. °· Take a warm bath to relieve any burning or pain from frequent diarrhea episodes. °SEEK IMMEDIATE MEDICAL CARE IF:  °· You are unable to keep fluids down. °· You have persistent vomiting. °· You have blood in your stool, or your stools are black and tarry. °· You do not urinate in 6-8 hours, or there is only a small amount of very dark urine. °· You have abdominal pain that increases or localizes. °· You have weakness, dizziness, confusion, or light-headedness. °· You have a severe headache. °· Your diarrhea gets worse or does not get better. °· You have a fever or persistent symptoms for more than 2-3 days. °· You have a fever and your symptoms suddenly get worse. °MAKE SURE YOU:  °· Understand these instructions. °· Will watch your condition. °· Will get help right away if you are not doing well or get worse. °Document Released: 09/12/2002 Document Revised: 02/06/2014 Document Reviewed: 05/30/2012 °ExitCare® Patient Information ©2015 [employer]. This information is not  intended to replace advice given to you by your health care provider. Make sure you discuss any questions you have with your health care provider. ° °

## 2014-06-18 NOTE — ED Provider Notes (Signed)
CSN: [id number]     Arrival date & time 06/18/14  1622 History   First MD Initiated Contact with Patient 06/18/14 1634     Chief Complaint  Patient presents with  . Abdominal Pain   (Consider location/radiation/quality/duration/timing/severity/associated sxs/prior Treatment) HPI Comments: C/o diarrhea, began 5-7 days PTA. Watery stools at least 3-5 times/day with associated diffuse abdominal cramping. Denies fever. Daughter ill with same. Mild associated nausea but no vomiting. Symptoms are exacerbated by meal intake. On Augmentin Rx'ed by PCP for cough that she has had for  for 1 month. Contacted her PCP and her doctor was concerned that she has a C. Diff infection and told her to come here for testing. Denies GU issues.   Patient is a 33 y.o. female presenting with abdominal pain. The history is provided by the patient.  Abdominal Pain   Past Medical History  Diagnosis Date  . PONV (postoperative nausea and vomiting)     cries when awakening   Past Surgical History  Procedure Laterality Date  . Cesarean section  2006, 2009    x 2  . Arthroscopic knee  2012  . Mandible fracture surgery  1982    age 21  . Tmj arthroplasty  1996  . Wrist fracture surgery  1998  . Wrist ganglion excision  2011  . Cholecystectomy    . Abdominal hysterectomy  10/2011   Family History  Problem Relation Age of Onset  . Hypertension Father    History  Substance Use Topics  . Smoking status: Never Smoker   . Smokeless tobacco: Not on file  . Alcohol Use: No   OB History   Grav Para Term Preterm Abortions TAB SAB Ect Mult Living                 Review of Systems  Gastrointestinal: Positive for abdominal pain.  All other systems reviewed and are negative.   Allergies  Review of patient's allergies indicates no known allergies.  Home Medications   Prior to Admission medications   Medication Sig Start Date End Date Taking? Authorizing Provider  albuterol (PROVENTIL HFA;VENTOLIN HFA) 108  (90 BASE) MCG/ACT inhaler Inhale 2 puffs into the lungs every 4 (four) hours as needed for wheezing. 07/18/13  Yes Ryan M Dunn, PA-C  amoxicillin-clavulanate (AUGMENTIN) 875-125 MG per tablet Take 1 tablet by mouth 2 (two) times daily.   Yes Historical Provider, MD  benzonatate (TESSALON PERLES) 100 MG capsule Take 1 capsule (100 mg total) by mouth 3 (three) times daily as needed for cough. 07/18/13  Yes Ryan M Dunn, PA-C  buPROPion (WELLBUTRIN XL) 150 MG 24 hr tablet Take 150 mg by mouth daily.   Yes Historical Provider, MD  citalopram (CELEXA) 10 MG tablet Take 10 mg by mouth daily.   Yes Historical Provider, MD  azithromycin (ZITHROMAX Z-PAK) 250 MG tablet 2 tabs po first day, then 1 tab po next 4 days 07/18/13   Raymon Mutton Dunn, PA-C  HYDROcodone-homatropine [facility]) 5-1.5 MG/5ML syrup 1 TSP PO Q 4-6 HOURS PRN COUGH 07/18/13   Sondra Barges, PA-C  hyoscyamine (LEVSIN/SL) 0.125 MG SL tablet Place 1 tablet (0.125 mg total) under the tongue every 6 (six) hours as needed for cramping. 06/18/14   Mathis Fare Presson, PA   BP 124/91  Pulse 108  Temp(Src) 98 F (36.7 C) (Oral)  Resp 18  SpO2 94%  LMP 10/24/2011 Physical Exam  Nursing note and vitals reviewed. Constitutional: She is oriented to person, place, and  time. She appears well-developed and well-nourished. No distress.  HENT:  Head: Normocephalic and atraumatic.  Mouth/Throat: Oropharynx is clear and moist.  Eyes: Conjunctivae are normal. No scleral icterus.  Neck: Normal range of motion. Neck supple.  Cardiovascular: Normal rate, regular rhythm and normal heart sounds.   Pulmonary/Chest: Effort normal and breath sounds normal. No respiratory distress. She has no wheezes.  Abdominal: Soft. Normal appearance. Bowel sounds are decreased. There is no hepatosplenomegaly. There is tenderness. There is no rigidity, no rebound, no guarding and no CVA tenderness.    Outlines area is region of majority of abdominal discomfort.     Musculoskeletal: Normal range of motion.  Neurological: She is alert and oriented to person, place, and time.  Skin: Skin is warm and dry. No rash noted. No erythema.  Psychiatric: She has a normal mood and affect. Her behavior is normal.    ED Course  Procedures (including critical care time) Labs Review Labs Reviewed  CLOSTRIDIUM DIFFICILE BY PCR    Imaging Review No results found.   MDM   1. Diarrhea   2. Abdominal cramping    Unable to produce stool specimen here at [facility]. Patient sent home with stool collection kit and advised to return with specimen when available. Will provide Rx for Levsin for cramping and encourage adequate oral hydration. Patient states she does not need additional Rx for Zofran as she states she already has this medication at home. Follow up Real and/or PCP.   Daena Alper, [place] 06/18/14 [phone]

## 2014-06-18 NOTE — ED Notes (Signed)
C/o diarrhea onset 5 days ago- at least 3- 4 times/day.  Stools are watery with occ. small amount bright red blood. Hx. Hemorrhoids.  C/o bil. side pain that radiates around to her back for 3-4 days.  On Augmentin for cough that she has had for  for 1 month.  Her doctor was concerned that she has a C. Diff infection, when she called today and told her to come here.

## 2014-06-19 ENCOUNTER — Telehealth (HOSPITAL_COMMUNITY): Payer: Self-pay

## 2014-06-19 LAB — CLOSTRIDIUM DIFFICILE BY PCR: Toxigenic C. Difficile by PCR: POSITIVE — AB

## 2014-06-19 MED ORDER — METRONIDAZOLE 500 MG PO TABS: 500.0000 mg | ORAL_TABLET | Freq: Three times a day (TID) | ORAL | Status: DC

## 2014-06-19 NOTE — Progress Notes (Signed)
06/19/2014: Notified by the lab that patient's stool sample results are positive for C. Difficile. Will notify patient of results and initiate treatment with Metronidazole 500 mg po TID x 14 days and advise close follow up with her PCP (Dr. Osborne Casco.)

## 2014-06-19 NOTE — ED Notes (Signed)
Chart reviewed by Allena Katz PA.  Patient states her diarrhea has improved but her left side pain has gotten worse.  Patient denies SOB or bloody stools.  Alroy Dust spoke with patient and has added GI pathogen panel to stool request.  This seemed to make patient less anxious.  She was advised by Alroy Dust to take tylenol or Ibuprofen for pain.  If her pain continues to worsen she should go to the ER.  Patient expressed understanding.

## 2014-06-19 NOTE — ED Notes (Signed)
Lab called stating that C Diff was positive. Dot Been PA reviewed chart and E-scribed RX to pharmacy.  Patient called and made aware.  She will pick up RX today.

## 2014-06-19 NOTE — ED Provider Notes (Signed)
Medical screening examination/treatment/procedure(s) were performed by a resident physician or non-physician practitioner and as the supervising physician I was immediately available for consultation/collaboration.  Linna Darner, MD Family Medicine   Waldemar Dickens, MD 06/19/14 [phone]

## 2014-06-19 NOTE — Progress Notes (Signed)
Electronic prescription for Metronidazole 500mg  po TID sent to patient's pharmacy of choice in [place], [place]

## 2014-06-20 ENCOUNTER — Emergency Department (HOSPITAL_COMMUNITY)
Admission: EM | Admit: 2014-06-20 | Discharge: 2014-06-20 | Disposition: A | Discharge status: Home / Self Care | Payer: BC Managed Care – PPO

## 2014-06-20 ENCOUNTER — Encounter (HOSPITAL_BASED_OUTPATIENT_CLINIC_OR_DEPARTMENT_OTHER): Payer: Self-pay | Admitting: Emergency Medicine

## 2014-06-20 ENCOUNTER — Ambulatory Visit (HOSPITAL_COMMUNITY)
Admission: RE | Admit: 2014-06-20 | Discharge: 2014-06-20 | Disposition: A | Discharge status: Home / Self Care | Payer: BC Managed Care – PPO | Source: Ambulatory Visit | Attending: Emergency Medicine | Admitting: Emergency Medicine

## 2014-06-20 ENCOUNTER — Emergency Department (HOSPITAL_BASED_OUTPATIENT_CLINIC_OR_DEPARTMENT_OTHER)
Admission: EM | Admit: 2014-06-20 | Discharge: 2014-06-20 | Disposition: A | Discharge status: Home / Self Care | Payer: BC Managed Care – PPO | Attending: Emergency Medicine | Admitting: Emergency Medicine

## 2014-06-20 ENCOUNTER — Emergency Department (HOSPITAL_BASED_OUTPATIENT_CLINIC_OR_DEPARTMENT_OTHER): Payer: BC Managed Care – PPO

## 2014-06-20 DIAGNOSIS — Z79899 Other long term (current) drug therapy: Secondary | ICD-10-CM | POA: Insufficient documentation

## 2014-06-20 DIAGNOSIS — R1084 Generalized abdominal pain: Secondary | ICD-10-CM | POA: Insufficient documentation

## 2014-06-20 DIAGNOSIS — Z792 Long term (current) use of antibiotics: Secondary | ICD-10-CM | POA: Diagnosis not present

## 2014-06-20 DIAGNOSIS — A0472 Enterocolitis due to Clostridium difficile, not specified as recurrent: Secondary | ICD-10-CM | POA: Insufficient documentation

## 2014-06-20 LAB — COMPREHENSIVE METABOLIC PANEL
ALK PHOS: 74 U/L (ref 39–117)
ALT: 51 U/L — ABNORMAL HIGH (ref 0–35)
AST: 39 U/L — AB (ref 0–37)
Albumin: 4.1 g/dL (ref 3.5–5.2)
Anion gap: 15 (ref 5–15)
BILIRUBIN TOTAL: 0.3 mg/dL (ref 0.3–1.2)
BUN: 11 mg/dL (ref 6–23)
CHLORIDE: 101 meq/L (ref 96–112)
CO2: 25 mEq/L (ref 19–32)
Calcium: 9.6 mg/dL (ref 8.4–10.5)
Creatinine, Ser: 0.8 mg/dL (ref 0.50–1.10)
GFR calc Af Amer: >90 mL/min (ref >90)
GFR calc non Af Amer: >90 mL/min (ref >90)
Glucose, Bld: 87 mg/dL (ref 70–99)
POTASSIUM: 4.1 meq/L (ref 3.7–5.3)
SODIUM: 141 meq/L (ref 137–147)
Total Protein: 7.6 g/dL (ref 6.0–8.3)

## 2014-06-20 LAB — GI PATHOGEN PANEL BY PCR, STOOL
C difficile toxin A/B: POSITIVE
Campylobacter by PCR: NEGATIVE
Cryptosporidium by PCR: NEGATIVE
E COLI (STEC): NEGATIVE
E COLI 0157 BY PCR: NEGATIVE
E coli (ETEC) LT/ST: NEGATIVE
G lamblia by PCR: NEGATIVE
Norovirus GI/GII: NEGATIVE
ROTAVIRUS A BY PCR: NEGATIVE
SALMONELLA BY PCR: NEGATIVE
Shigella by PCR: NEGATIVE

## 2014-06-20 LAB — CBC WITH DIFFERENTIAL/PLATELET
Basophils Absolute: 0.1 10*3/uL (ref 0.0–0.1)
Basophils Relative: 1 % (ref 0–1)
Eosinophils Absolute: 0.2 10*3/uL (ref 0.0–0.7)
Eosinophils Relative: 2 % (ref 0–5)
HCT: 44.1 % (ref 36.0–46.0)
HEMOGLOBIN: 14.1 g/dL (ref 12.0–15.0)
Lymphocytes Relative: 24 % (ref 12–46)
Lymphs Abs: 2.5 10*3/uL (ref 0.7–4.0)
MCH: 27.5 pg (ref 26.0–34.0)
MCHC: 32 g/dL (ref 30.0–36.0)
MCV: 86.1 fL (ref 78.0–100.0)
MONOS PCT: 6 % (ref 3–12)
Monocytes Absolute: 0.6 10*3/uL (ref 0.1–1.0)
NEUTROS ABS: 6.7 10*3/uL (ref 1.7–7.7)
NEUTROS PCT: 67 % (ref 43–77)
PLATELETS: 404 10*3/uL — AB (ref 150–400)
RBC: 5.12 MIL/uL — ABNORMAL HIGH (ref 3.87–5.11)
RDW: 14.9 % (ref 11.5–15.5)
WBC: 10.1 10*3/uL (ref 4.0–10.5)

## 2014-06-20 LAB — LIPASE, BLOOD: Lipase: 44 U/L (ref 11–59)

## 2014-06-20 MED ORDER — IOHEXOL 300 MG/ML  SOLN
100.0000 mL | Freq: Once | INTRAMUSCULAR | Status: AC | PRN
  Administered 2014-06-20: 100 mL via INTRAVENOUS

## 2014-06-20 MED ORDER — MORPHINE SULFATE 4 MG/ML IJ SOLN
4.0000 mg | Freq: Once | INTRAMUSCULAR | Status: AC
  Administered 2014-06-20: 4 mg via INTRAVENOUS
  Filled 2014-06-20: qty 1

## 2014-06-20 MED ORDER — ONDANSETRON HCL 4 MG/2ML IJ SOLN
4.0000 mg | Freq: Once | INTRAMUSCULAR | Status: AC
  Administered 2014-06-20: 4 mg via INTRAVENOUS
  Filled 2014-06-20: qty 2

## 2014-06-20 MED ORDER — OXYCODONE-ACETAMINOPHEN 5-325 MG PO TABS
2.0000 | ORAL_TABLET | Freq: Once | ORAL | Status: AC
  Administered 2014-06-20: 2 via ORAL
  Filled 2014-06-20: qty 2

## 2014-06-20 MED ORDER — OXYCODONE-ACETAMINOPHEN 5-325 MG PO TABS: 1.0000 | ORAL_TABLET | ORAL | Status: DC | PRN

## 2014-06-20 MED ORDER — IOHEXOL 300 MG/ML  SOLN
50.0000 mL | Freq: Once | INTRAMUSCULAR | Status: AC | PRN
  Administered 2014-06-20: 50 mL via ORAL

## 2014-06-20 NOTE — ED Provider Notes (Signed)
Medical screening examination/treatment/procedure(s) were performed by non-physician practitioner and as supervising physician I was immediately available for consultation/collaboration.   EKG Interpretation None       Threasa Beards, MD 06/20/14 [phone]

## 2014-06-20 NOTE — Progress Notes (Signed)
Medical screening examination/treatment/procedure(s) were performed by a resident physician or non-physician practitioner and as the supervising physician I was immediately available for consultation/collaboration.  Linna Darner, MD Family Medicine

## 2014-06-20 NOTE — ED Provider Notes (Signed)
CSN: [id number]     Arrival date & time 06/20/14  1821 History   First MD Initiated Contact with Patient 06/20/14 1931     Chief Complaint  Patient presents with  . Abdominal Pain     (Consider location/radiation/quality/duration/timing/severity/associated sxs/prior Treatment) HPI Comments: Pt states that she has had left sided abdominal pain times 4 days. She has had diarrhea and was seen 3 days ago at urgent care and was diagnosed with c diff. Pt was started on flagyl today. Denies fever. States that the abdominal pain is worsening. No vomiting.  The history is provided by the patient. No language interpreter was used.    Past Medical History  Diagnosis Date  . PONV (postoperative nausea and vomiting)     cries when awakening   Past Surgical History  Procedure Laterality Date  . Cesarean section  2006, 2009    x 2  . Arthroscopic knee  2012  . Mandible fracture surgery  1982    age 33  . Tmj arthroplasty  1996  . Wrist fracture surgery  1998  . Wrist ganglion excision  2011  . Cholecystectomy    . Abdominal hysterectomy  10/2011   Family History  Problem Relation Age of Onset  . Hypertension Father    History  Substance Use Topics  . Smoking status: Never Smoker   . Smokeless tobacco: Not on file  . Alcohol Use: No   OB History   Grav Para Term Preterm Abortions TAB SAB Ect Mult Living                 Review of Systems  Constitutional: Negative.   Respiratory: Negative.   Cardiovascular: Negative.       Allergies  Review of patient's allergies indicates no known allergies.  Home Medications   Prior to Admission medications   Medication Sig Start Date End Date Taking? Authorizing Provider  albuterol (PROVENTIL HFA;VENTOLIN HFA) 108 (90 BASE) MCG/ACT inhaler Inhale 2 puffs into the lungs every 4 (four) hours as needed for wheezing. 07/18/13   Rise Mu, PA-C  amoxicillin-clavulanate (AUGMENTIN) 875-125 MG per tablet Take 1 tablet by mouth 2 (two) times  daily.    Historical Provider, MD  azithromycin (ZITHROMAX Z-PAK) 250 MG tablet 2 tabs po first day, then 1 tab po next 4 days 07/18/13   Areta Haber Dunn, PA-C  benzonatate (TESSALON PERLES) 100 MG capsule Take 1 capsule (100 mg total) by mouth 3 (three) times daily as needed for cough. 07/18/13   Rise Mu, PA-C  buPROPion (WELLBUTRIN XL) 150 MG 24 hr tablet Take 150 mg by mouth daily.    Historical Provider, MD  citalopram (CELEXA) 10 MG tablet Take 10 mg by mouth daily.    Historical Provider, MD  HYDROcodone-homatropine [facility]) 5-1.5 MG/5ML syrup 1 TSP PO Q 4-6 HOURS PRN COUGH 07/18/13   Areta Haber Dunn, PA-C  hyoscyamine (LEVSIN/SL) 0.125 MG SL tablet Place 1 tablet (0.125 mg total) under the tongue every 6 (six) hours as needed for cramping. 06/18/14   Audelia Hives Presson, PA  metroNIDAZOLE (FLAGYL) 500 MG tablet Take 1 tablet (500 mg total) by mouth 3 (three) times daily. X 14 days 06/19/14   Audelia Hives Presson, PA   BP 116/79  Pulse 89  Temp(Src) 98.1 F (36.7 C) (Oral)  Resp 18  Ht 5\' 4"  (1.626 m)  Wt 220 lb (99.791 kg)  BMI 37.74 kg/m2  SpO2 97%  LMP 10/24/2011 Physical Exam  Vitals  reviewed. Constitutional: She is oriented to person, place, and time. She appears well-developed and well-nourished.  Cardiovascular: Normal rate and regular rhythm.   Pulmonary/Chest: Effort normal and breath sounds normal.  Abdominal: Soft.  Generalized left sided abdominal pain  Musculoskeletal: Normal range of motion.  Neurological: She is alert and oriented to person, place, and time.  Skin: Skin is warm and dry.    ED Course  Procedures (including critical care time) Labs Review Labs Reviewed  CBC WITH DIFFERENTIAL - Abnormal; Notable for the following:    RBC 5.12 (*)    Platelets 404 (*)    All other components within normal limits  COMPREHENSIVE METABOLIC PANEL - Abnormal; Notable for the following:    AST 39 (*)    ALT 51 (*)    All other components within normal limits   LIPASE, BLOOD    Imaging Review Ct Abdomen Pelvis W Contrast  06/20/2014   CLINICAL DATA:  Left abdominal pain. History of C difficile colitis.  EXAM: CT ABDOMEN AND PELVIS WITH CONTRAST  TECHNIQUE: Multidetector CT imaging of the abdomen and pelvis was performed using the standard protocol following bolus administration of intravenous contrast.  CONTRAST:  21mL OMNIPAQUE IOHEXOL 300 MG/ML SOLN, 170mL OMNIPAQUE IOHEXOL 300 MG/ML SOLN  COMPARISON:  03/02/2013  FINDINGS: Lower chest: Faint reticulonodular interstitial opacity in the right lower lobe favors atypical infectious bronchiolitis.  Hepatobiliary: Diffuse hepatic steatosis. Partially exophytic mass extending anteriorly from the lateral segment left hepatic lobe measures 3.1 by 2.7 cm, formerly 3.7 x 3.5 cm, and is faintly hyperdense to the liver. Prior cholecystectomy.  Spleen: Intact  Pancreas: Intact  Stomach/Bowel: No current findings of colitis. Appendix and stomach normal. No specific small bowel abnormality observed.  Adrenals/urinary tract: Intact  Vascular/Lymphatic: Intact  Reproductive: Uterus absent.  Ovaries unremarkable.  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  Other: No ascites.  IMPRESSION: 1. No specific abnormality to account for the patient's left abdominal pain. There currently no findings of colitis. 2. 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 3. Reduced size of the left hepatic lobe partially exophytic mass. This was worked up in 2009 by MRI (nose images are not available) and was thought to probably represent adenoma or focal nodular hyperplasia based on the report from that exam. There is no surrounding ascites. 4. Diffuse hepatic steatosis. 5. Faint reticulonodular interstitial opacity in the right lower lobe favoring atypical infectious bronchiolitis.   Electronically Signed    By: Sherryl Barters M.D.   On: 06/20/2014 22:06     EKG Interpretation None      MDM   Final diagnoses:  Generalized abdominal pain  C. difficile diarrhea    No acute abdominal process noted. Noted lung finding although pt is not having respiratory symptoms at this time. Will send home percocet for pain    Glendell Docker, NP 06/20/14 2218

## 2014-06-20 NOTE — ED Notes (Signed)
Abdominal pain in her left side. Hx of cdiff. Her MD can't see her until tomorrow. She was seen at [facility] on Sunday and had a stool culture that showed cdiff.

## 2014-06-20 NOTE — Discharge Instructions (Signed)
Abdominal Pain, Women °Abdominal (stomach, pelvic, or belly) pain can be caused by many things. It is important to tell your doctor: °· The location of the pain. °· Does it come and go or is it present all the time? °· Are there things that start the pain (eating certain foods, exercise)? °· Are there other symptoms associated with the pain (fever, nausea, vomiting, diarrhea)? °All of this is helpful to know when trying to find the cause of the pain. °CAUSES  °· Stomach: virus or bacteria infection, or ulcer. °· Intestine: appendicitis (inflamed appendix), regional ileitis (Crohn's disease), ulcerative colitis (inflamed colon), irritable bowel syndrome, diverticulitis (inflamed diverticulum of the colon), or cancer of the stomach or intestine. °· Gallbladder disease or stones in the gallbladder. °· Kidney disease, kidney stones, or infection. °· Pancreas infection or cancer. °· Fibromyalgia (pain disorder). °· Diseases of the female organs: °¨ Uterus: fibroid (non-cancerous) tumors or infection. °¨ Fallopian tubes: infection or tubal pregnancy. °¨ Ovary: cysts or tumors. °¨ Pelvic adhesions (scar tissue). °¨ Endometriosis (uterus lining tissue growing in the pelvis and on the pelvic organs). °¨ Pelvic congestion syndrome (female organs filling up with blood just before the menstrual period). °¨ Pain with the menstrual period. °¨ Pain with ovulation (producing an egg). °¨ Pain with an IUD (intrauterine device, birth control) in the uterus. °¨ Cancer of the female organs. °· Functional pain (pain not caused by a disease, may improve without treatment). °· Psychological pain. °· Depression. °DIAGNOSIS  °Your doctor will decide the seriousness of your pain by doing an examination. °· Blood tests. °· X-rays. °· Ultrasound. °· CT scan (computed tomography, special type of X-ray). °· MRI (magnetic resonance imaging). °· Cultures, for infection. °· Barium enema (dye inserted in the large intestine, to better view it with  X-rays). °· Colonoscopy (looking in intestine with a lighted tube). °· Laparoscopy (minor surgery, looking in abdomen with a lighted tube). °· Major abdominal exploratory surgery (looking in abdomen with a large incision). °TREATMENT  °The treatment will depend on the cause of the pain.  °· Many cases can be observed and treated at home. °· Over-the-counter medicines recommended by your caregiver. °· Prescription medicine. °· Antibiotics, for infection. °· Birth control pills, for painful periods or for ovulation pain. °· Hormone treatment, for endometriosis. °· Nerve blocking injections. °· Physical therapy. °· Antidepressants. °· Counseling with a psychologist or psychiatrist. °· Minor or major surgery. °HOME CARE INSTRUCTIONS  °· Do not take laxatives, unless directed by your caregiver. °· Take over-the-counter pain medicine only if ordered by your caregiver. Do not take aspirin because it can cause an upset stomach or bleeding. °· Try a clear liquid diet (broth or water) as ordered by your caregiver. Slowly move to a bland diet, as tolerated, if the pain is related to the stomach or intestine. °· Have a thermometer and take your temperature several times a day, and record it. °· Bed rest and sleep, if it helps the pain. °· Avoid sexual intercourse, if it causes pain. °· Avoid stressful situations. °· Keep your follow-up appointments and tests, as your caregiver orders. °· If the pain does not go away with medicine or surgery, you may try: °¨ Acupuncture. °¨ Relaxation exercises (yoga, meditation). °¨ Group therapy. °¨ Counseling. °SEEK MEDICAL CARE IF:  °· You notice certain foods cause stomach pain. °· Your home care treatment is not helping your pain. °· You need stronger pain medicine. °· You want your IUD removed. °· You feel faint or   lightheaded. °· You develop nausea and vomiting. °· You develop a rash. °· You are having side effects or an allergy to your medicine. °SEEK IMMEDIATE MEDICAL CARE IF:  °· Your  pain does not go away or gets worse. °· You have a fever. °· Your pain is felt only in portions of the abdomen. The right side could possibly be appendicitis. The left lower portion of the abdomen could be colitis or diverticulitis. °· You are passing blood in your stools (bright red or black tarry stools, with or without vomiting). °· You have blood in your urine. °· You develop chills, with or without a fever. °· You pass out. °MAKE SURE YOU:  °· Understand these instructions. °· Will watch your condition. °· Will get help right away if you are not doing well or get worse. °Document Released: 07/20/2007 Document Revised: 02/06/2014 Document Reviewed: 08/09/2009 °ExitCare® Patient Information ©2015 [employer]. This information is not intended to replace advice given to you by your health care provider. Make sure you discuss any questions you have with your health care provider. ° °

## 2014-07-19 ENCOUNTER — Encounter: Payer: Self-pay | Admitting: Gastroenterology

## 2014-09-20 ENCOUNTER — Ambulatory Visit: Payer: BC Managed Care – PPO | Admitting: Gastroenterology

## 2014-10-16 ENCOUNTER — Encounter (HOSPITAL_BASED_OUTPATIENT_CLINIC_OR_DEPARTMENT_OTHER): Payer: Self-pay | Admitting: *Deleted

## 2014-10-16 ENCOUNTER — Emergency Department (HOSPITAL_BASED_OUTPATIENT_CLINIC_OR_DEPARTMENT_OTHER)
Admission: EM | Admit: 2014-10-16 | Discharge: 2014-10-17 | Disposition: A | Discharge status: Home / Self Care | Payer: BLUE CROSS/BLUE SHIELD | Attending: Emergency Medicine | Admitting: Emergency Medicine

## 2014-10-16 ENCOUNTER — Emergency Department (HOSPITAL_BASED_OUTPATIENT_CLINIC_OR_DEPARTMENT_OTHER): Payer: BLUE CROSS/BLUE SHIELD

## 2014-10-16 DIAGNOSIS — Z8619 Personal history of other infectious and parasitic diseases: Secondary | ICD-10-CM | POA: Diagnosis not present

## 2014-10-16 DIAGNOSIS — R600 Localized edema: Secondary | ICD-10-CM | POA: Insufficient documentation

## 2014-10-16 DIAGNOSIS — M542 Cervicalgia: Secondary | ICD-10-CM | POA: Diagnosis not present

## 2014-10-16 DIAGNOSIS — R2242 Localized swelling, mass and lump, left lower limb: Secondary | ICD-10-CM | POA: Diagnosis present

## 2014-10-16 DIAGNOSIS — Z79899 Other long term (current) drug therapy: Secondary | ICD-10-CM | POA: Insufficient documentation

## 2014-10-16 DIAGNOSIS — Z792 Long term (current) use of antibiotics: Secondary | ICD-10-CM | POA: Insufficient documentation

## 2014-10-16 DIAGNOSIS — R609 Edema, unspecified: Secondary | ICD-10-CM

## 2014-10-16 HISTORY — DX: Other bacterial infections of unspecified site: A49.8

## 2014-10-16 NOTE — ED Notes (Signed)
Patient in [place].

## 2014-10-16 NOTE — ED Notes (Signed)
C/o L foot swelling this am around 0900, developed to L calf "appears larger than R", denies cal or leg pain, also reports bilateral and anterior neck discomfort onset ~ 3 hrs ago, "R side of neck also feels swollen. Mentions R shoulder pain for last 3 nights. Admits to some dizziness, "just don't feel right", (denies: CP, sob, abd pain, nvd, fever, bleeding). Denies BCP, smoking or travel. Alert, NAD, calm, interactive, no dyspnea noted. Pedal pulses palpable, calves supple & soft, BLE equally warm.

## 2014-10-17 MED ORDER — PANTOPRAZOLE SODIUM 40 MG PO TBEC
40.0000 mg | DELAYED_RELEASE_TABLET | Freq: Once | ORAL | Status: AC
  Administered 2014-10-17: 40 mg via ORAL
  Filled 2014-10-17: qty 1

## 2014-10-17 NOTE — ED Provider Notes (Signed)
CSN: [id number]     Arrival date & time 10/16/14  2228 History  This chart was scribed for Wynetta Fines, MD by Hilda Lias, ED Scribe. This patient was seen in room MH02/MH02 and the patient's care was started at 12:43 AM.    Chief Complaint  Patient presents with  . Leg Swelling      The history is provided by the patient. No language interpreter was used.    HPI Comments: Kathryn [name] is a 34 y.o. female who presents to the Emergency Department complaining of left lower leg and foot swelling that she first noticed when she woke up around 0900 yesterday morning. She denies any associated pain or discoloration. She denies injury. There is no specific mitigating or exacerbating factor. She also complains of pain in her right neck that is been present for several hours; localized to the sternocleidomastoid he has muscle. She denies any recent travel. She denies being on birth control pills. She denies shortness of breath, abdominal pain, nausea, vomiting, diarrhea, fever or chills. She is having some chest discomfort which she describes as identical to indigestion she had when she was pregnant. She has not taken anything for this.    Past Medical History  Diagnosis Date  . PONV (postoperative nausea and vomiting)     cries when awakening  . Clostridium difficile infection 06/2014    not hospitalized   Past Surgical History  Procedure Laterality Date  . Cesarean section  2006, 2009    x 2  . Arthroscopic knee  2012  . Mandible fracture surgery  1982    age 77  . Tmj arthroplasty  1996  . Wrist fracture surgery  1998  . Wrist ganglion excision  2011  . Cholecystectomy    . Abdominal hysterectomy  10/2011   Family History  Problem Relation Age of Onset  . Hypertension Father    History  Substance Use Topics  . Smoking status: Never Smoker   . Smokeless tobacco: Not on file  . Alcohol Use: No   OB History    No data available     Review of Systems   Constitutional: Negative for fever.  Respiratory: Negative for shortness of breath.   Cardiovascular: Positive for leg swelling.  Gastrointestinal: Negative for nausea, vomiting, abdominal pain and diarrhea.  Musculoskeletal: Positive for neck pain and neck stiffness.  All other systems reviewed and are negative.   Allergies  Review of patient's allergies indicates no known allergies.  Home Medications   Prior to Admission medications   Medication Sig Start Date End Date Taking? Authorizing Provider  albuterol (PROVENTIL HFA;VENTOLIN HFA) 108 (90 BASE) MCG/ACT inhaler Inhale 2 puffs into the lungs every 4 (four) hours as needed for wheezing. 07/18/13   Rise Mu, PA-C  amoxicillin-clavulanate (AUGMENTIN) 875-125 MG per tablet Take 1 tablet by mouth 2 (two) times daily.    Historical Provider, MD  azithromycin (ZITHROMAX Z-PAK) 250 MG tablet 2 tabs po first day, then 1 tab po next 4 days 07/18/13   Areta Haber Dunn, PA-C  benzonatate (TESSALON PERLES) 100 MG capsule Take 1 capsule (100 mg total) by mouth 3 (three) times daily as needed for cough. 07/18/13   Rise Mu, PA-C  buPROPion (WELLBUTRIN XL) 150 MG 24 hr tablet Take 150 mg by mouth daily.    Historical Provider, MD  citalopram (CELEXA) 10 MG tablet Take 10 mg by mouth daily.    Historical Provider, MD  HYDROcodone-homatropine [facility]) 5-1.5 MG/5ML  syrup 1 TSP PO Q 4-6 HOURS PRN COUGH 07/18/13   Rise Mu, PA-C  hyoscyamine (LEVSIN/SL) 0.125 MG SL tablet Place 1 tablet (0.125 mg total) under the tongue every 6 (six) hours as needed for cramping. 06/18/14   Audelia Hives Presson, PA  metroNIDAZOLE (FLAGYL) 500 MG tablet Take 1 tablet (500 mg total) by mouth 3 (three) times daily. X 14 days 06/19/14   Lutricia Feil, PA  oxyCODONE-acetaminophen (PERCOCET/ROXICET) 5-325 MG per tablet Take 1-2 tablets by mouth every 4 (four) hours as needed for moderate pain or severe pain. 06/20/14   Glendell Docker, NP   BP 144/77 mmHg   Pulse 90  Temp(Src) 98.4 F (36.9 C) (Oral)  Resp 22  Ht 5\' 5"  (1.651 m)  Wt 235 lb (106.595 kg)  BMI 39.11 kg/m2  SpO2 100%  LMP 10/24/2011   Physical Exam  General: Well-developed, well-nourished female in no acute distress; appearance consistent with age of record HENT: normocephalic; atraumatic; oropharynx normal; no TMJ tenderness; tenderness of right sternocleidomastoid muscle Eyes: pupils equal, round and reactive to light; extraocular muscles intact Neck: supple Heart: regular rate and rhythm; no murmur Chest: Nontender Lungs: clear to auscultation bilaterally Abdomen: soft; nondistended; nontender; no masses or hepatosplenomegaly; bowel sounds present Extremities: No deformity; full range of motion; pulses normal; mild edema of left lower leg and foot without erythema, ecchymosis or warmth; left calf is non-tender Neurologic: Awake, alert and oriented; motor function intact in all extremities and symmetric; no facial droop Skin: Warm and dry Psychiatric: Normal mood and affect   ED Course  Procedures (including critical care time)  DIAGNOSTIC STUDIES: Oxygen Saturation is 100% on RA, normal by my interpretation.    COORDINATION OF CARE: 12:49 AM Discussed treatment plan with pt at bedside and pt agreed to plan.   MDM  Nursing notes and vitals signs, including pulse oximetry, reviewed.  Summary of this visit's results, reviewed by myself:   EKG Interpretation  Date/Time:  Tuesday October 17 2014 00:58:05 EST Ventricular Rate:  86 PR Interval:  140 QRS Duration: 88 QT Interval:  386 QTC Calculation: 461 R Axis:   51 Text Interpretation:  Normal sinus rhythm Normal ECG No old tracing to compare Confirmed by Jaena Brocato  MD, Jenny Reichmann ([id number]) on 10/17/2014 1:30:11 AM       Imaging Studies: US Venous Img Lower Unilateral Left  10/17/2014   CLINICAL DATA:  Acute onset of left leg swelling and pain. Initial encounter.  EXAM: LEFT LOWER EXTREMITY VENOUS DOPPLER  ULTRASOUND  TECHNIQUE: Gray-scale sonography with graded compression, as well as color Doppler and duplex ultrasound were performed to evaluate the lower extremity deep venous systems from the level of the common femoral vein and including the common femoral, femoral, profunda femoral, popliteal and calf veins including the posterior tibial, peroneal and gastrocnemius veins when visible. The superficial great saphenous vein was also interrogated. Spectral Doppler was utilized to evaluate flow at rest and with distal augmentation maneuvers in the common femoral, femoral and popliteal veins.  COMPARISON:  None.  FINDINGS: Contralateral Common Femoral Vein: Respiratory phasicity is normal and symmetric with the symptomatic side. No evidence of thrombus. Normal compressibility.  Common Femoral Vein: No evidence of thrombus. Normal compressibility, respiratory phasicity and response to augmentation.  Saphenofemoral Junction: No evidence of thrombus. Normal compressibility and flow on color Doppler imaging.  Profunda Femoral Vein: No evidence of thrombus. Normal compressibility and flow on color Doppler imaging.  Femoral Vein: No evidence of thrombus. Normal  compressibility, respiratory phasicity and response to augmentation.  Popliteal Vein: No evidence of thrombus. Normal compressibility, respiratory phasicity and response to augmentation.  Calf Veins: No evidence of thrombus. Normal compressibility and flow on color Doppler imaging.  Superficial Great Saphenous Vein: No evidence of thrombus. Normal compressibility and flow on color Doppler imaging.  Venous Reflux:  None.  Other Findings:  None.  IMPRESSION: No evidence of deep venous thrombosis.   Electronically Signed   By: Garald Balding M.D.   On: 10/17/2014 00:07    I personally performed the services described in this documentation, which was scribed in my presence. The recorded information has been reviewed and is accurate.   Wynetta Fines, MD 10/17/14  [phone]

## 2014-10-17 NOTE — Discharge Instructions (Signed)
Peripheral Edema °You have swelling in your legs (peripheral edema). This swelling is due to excess accumulation of salt and water in your body. Edema may be a sign of heart, kidney or liver disease, or a side effect of a medication. It may also be due to problems in the leg veins. Elevating your legs and using special support stockings may be very helpful, if the cause of the swelling is due to poor venous circulation. Avoid long periods of standing, whatever the cause. °Treatment of edema depends on identifying the cause. Chips, pretzels, pickles and other salty foods should be avoided. Restricting salt in your diet is almost always needed. Water pills (diuretics) are often used to remove the excess salt and water from your body via urine. These medicines prevent the kidney from reabsorbing sodium. This increases urine flow. °Diuretic treatment may also result in lowering of potassium levels in your body. Potassium supplements may be needed if you have to use diuretics daily. Daily weights can help you keep track of your progress in clearing your edema. You should call your caregiver for follow up care as recommended. °SEEK IMMEDIATE MEDICAL CARE IF:  °· You have increased swelling, pain, redness, or heat in your legs. °· You develop shortness of breath, especially when lying down. °· You develop chest or abdominal pain, weakness, or fainting. °· You have a fever. °Document Released: 10/30/2004 Document Revised: 12/15/2011 Document Reviewed: 10/10/2009 °Exit[employer]® Patient Information ©2015 [employer]. This information is not intended to replace advice given to you by your health care provider. Make sure you discuss any questions you have with your health care provider. ° °

## 2015-01-08 ENCOUNTER — Emergency Department (HOSPITAL_BASED_OUTPATIENT_CLINIC_OR_DEPARTMENT_OTHER): Payer: BLUE CROSS/BLUE SHIELD

## 2015-01-08 ENCOUNTER — Emergency Department (HOSPITAL_BASED_OUTPATIENT_CLINIC_OR_DEPARTMENT_OTHER)
Admission: EM | Admit: 2015-01-08 | Discharge: 2015-01-08 | Disposition: A | Discharge status: Home / Self Care | Payer: BLUE CROSS/BLUE SHIELD | Attending: Emergency Medicine | Admitting: Emergency Medicine

## 2015-01-08 ENCOUNTER — Encounter (HOSPITAL_BASED_OUTPATIENT_CLINIC_OR_DEPARTMENT_OTHER): Payer: Self-pay

## 2015-01-08 DIAGNOSIS — Z79899 Other long term (current) drug therapy: Secondary | ICD-10-CM | POA: Diagnosis not present

## 2015-01-08 DIAGNOSIS — J40 Bronchitis, not specified as acute or chronic: Secondary | ICD-10-CM | POA: Diagnosis not present

## 2015-01-08 DIAGNOSIS — Z8701 Personal history of pneumonia (recurrent): Secondary | ICD-10-CM | POA: Insufficient documentation

## 2015-01-08 DIAGNOSIS — R079 Chest pain, unspecified: Secondary | ICD-10-CM | POA: Diagnosis present

## 2015-01-08 DIAGNOSIS — Z8619 Personal history of other infectious and parasitic diseases: Secondary | ICD-10-CM | POA: Insufficient documentation

## 2015-01-08 HISTORY — DX: Pneumonia, unspecified organism: J18.9

## 2015-01-08 MED ORDER — OXYCODONE-ACETAMINOPHEN 5-325 MG PO TABS
2.0000 | ORAL_TABLET | Freq: Four times a day (QID) | ORAL | Status: AC | PRN
  Administered 2015-01-08: 2 via ORAL
  Filled 2015-01-08: qty 2

## 2015-01-08 MED ORDER — HYDROCODONE-ACETAMINOPHEN 5-325 MG PO TABS: 2.0000 | ORAL_TABLET | ORAL | Status: DC | PRN

## 2015-01-08 MED ORDER — IBUPROFEN 800 MG PO TABS
800.0000 mg | ORAL_TABLET | Freq: Once | ORAL | Status: AC
  Administered 2015-01-08: 800 mg via ORAL
  Filled 2015-01-08: qty 1

## 2015-01-08 MED ORDER — IBUPROFEN 800 MG PO TABS: 800.0000 mg | ORAL_TABLET | Freq: Three times a day (TID) | ORAL | Status: DC

## 2015-01-08 NOTE — Discharge Instructions (Signed)
Please call your doctor for a followup appointment within 24-48 hours. When you talk to your doctor please let them know that you were seen in the emergency department and have them acquire all of your records so that they can discuss the findings with you and formulate a treatment plan to fully care for your new and ongoing problems. ° °

## 2015-01-08 NOTE — ED Notes (Signed)
Dx with pneumonia at urgent care yesterday-pain to bilat chest with worse to right

## 2015-01-08 NOTE — ED Notes (Addendum)
Pt reports she was seen at [facility] yesterday and given Levaquin and a cough syrup. Pt reports subjective fevers at home with bodyaches.

## 2015-01-08 NOTE — ED Notes (Signed)
MD at bedside. 

## 2015-01-08 NOTE — ED Notes (Signed)
Patient transported to X-ray 

## 2015-01-08 NOTE — ED Notes (Signed)
Patient to triage room via wheelchair, patient appears to be sleeping and NAD noted.

## 2015-01-08 NOTE — ED Provider Notes (Signed)
CSN: [id number]     Arrival date & time 01/08/15  1233 History   First MD Initiated Contact with Patient 01/08/15 1243     Chief Complaint  Patient presents with  . Pneumonia     (Consider location/radiation/quality/duration/timing/severity/associated sxs/prior Treatment) HPI Comments: 34 year old female, has been having 3 days of body aches and fevers as high as 104 at home, seen at urgent care yesterday after having some left-sided chest pain, x-ray revealed a possible pneumonia, she was given Rocephin intramuscular as well as discharged with a prescription for Levaquin which she filled and has taken the first dose. She reports that today she has developed pain in the right side of her chest, her coughing has been minimal, her bodyaches continue, she has no risk factors for pulmonary embolism. She has had no pain medication prior to arrival but has been taking an antitussive prescription liquid. The pain in the chest is persistent, worse with deep breathing and coughing, associated with a slight hoarseness and raspiness of the voice  Patient is a 34 y.o. female presenting with pneumonia. The history is provided by the patient.  Pneumonia    Past Medical History  Diagnosis Date  . PONV (postoperative nausea and vomiting)     cries when awakening  . Clostridium difficile infection 06/2014    not hospitalized  . Pneumonia    Past Surgical History  Procedure Laterality Date  . Cesarean section  2006, 2009    x 2  . Arthroscopic knee  2012  . Mandible fracture surgery  1982    age 28  . Tmj arthroplasty  1996  . Wrist fracture surgery  1998  . Wrist ganglion excision  2011  . Cholecystectomy    . Abdominal hysterectomy  10/2011   Family History  Problem Relation Age of Onset  . Hypertension Father    History  Substance Use Topics  . Smoking status: Never Smoker   . Smokeless tobacco: Not on file  . Alcohol Use: No   OB History    No data available     Review of Systems   All other systems reviewed and are negative.     Allergies  Review of patient's allergies indicates no known allergies.  Home Medications   Prior to Admission medications   Medication Sig Start Date End Date Taking? Authorizing Provider  Guaifenesin-Codeine [facility] C-NR PO) Take by mouth.   Yes Historical Provider, MD  buPROPion (WELLBUTRIN XL) 150 MG 24 hr tablet Take 150 mg by mouth daily.    Historical Provider, MD  citalopram (CELEXA) 10 MG tablet Take 10 mg by mouth daily.    Historical Provider, MD  HYDROcodone-acetaminophen (NORCO/VICODIN) 5-325 MG per tablet Take 2 tablets by mouth every 4 (four) hours as needed. 01/08/15   Noemi Chapel, MD  ibuprofen (ADVIL,MOTRIN) 800 MG tablet Take 1 tablet (800 mg total) by mouth 3 (three) times daily. 01/08/15   Noemi Chapel, MD   BP 148/96 mmHg  Pulse 94  Temp(Src) 99.1 F (37.3 C) (Oral)  Resp 16  Ht 5\' 5"  (1.651 m)  Wt 238 lb (107.956 kg)  BMI 39.61 kg/m2  SpO2 98%  LMP 10/24/2011 Physical Exam  Constitutional: She appears well-developed and well-nourished. No distress.  HENT:  Head: Normocephalic and atraumatic.  Mouth/Throat: Oropharynx is clear and moist. No oropharyngeal exudate.  Eyes: Conjunctivae and EOM are normal. Pupils are equal, round, and reactive to light. Right eye exhibits no discharge. Left eye exhibits no discharge. No scleral icterus.  Neck: Normal range of motion. Neck supple. No JVD present. No thyromegaly present.  Cardiovascular: Normal rate, regular rhythm, normal heart sounds and intact distal pulses.  Exam reveals no gallop and no friction rub.   No murmur heard. Pulmonary/Chest: Effort normal and breath sounds normal. No respiratory distress. She has no wheezes. She has no rales. She exhibits tenderness (tenderness over the right upper chest wall).  Abdominal: Soft. Bowel sounds are normal. She exhibits no distension and no mass. There is no tenderness.  Musculoskeletal: Normal range of motion. She  exhibits no edema or tenderness.  Lymphadenopathy:    She has no cervical adenopathy.  Neurological: She is alert. Coordination normal.  Skin: Skin is warm and dry. No rash noted. No erythema.  Psychiatric: She has a normal mood and affect. Her behavior is normal.  Nursing note and vitals reviewed.   ED Course  Procedures (including critical care time) Labs Review Labs Reviewed - No data to display  Imaging Review Dg Chest 2 View  01/08/2015   CLINICAL DATA:  Cough, congestion, fever for 2 days  EXAM: CHEST  2 VIEW  COMPARISON:  07/18/2013  FINDINGS: The heart size and mediastinal contours are within normal limits. Both lungs are clear. The visualized skeletal structures are unremarkable.  IMPRESSION: No active cardiopulmonary disease.   Electronically Signed   By: Kathreen Devoid   On: 01/08/2015 13:21      MDM   Final diagnoses:  Chest pain  Bronchitis    No wheezing or rales, vital signs remain in a normal range with no fever or tachycardia or hypoxia. Will obtain chest x-ray to verify the presence of pneumonia or possible pneumothorax, low risk for pulmonary embolism, possibly bronchitis with voice change.  cxr results given to pt - well appearing, no signs of pna - VS normal - improved with meds.  Meds given in ED:  Medications  ibuprofen (ADVIL,MOTRIN) tablet 800 mg (800 mg Oral Given 01/08/15 1259)  oxyCODONE-acetaminophen (PERCOCET/ROXICET) 5-325 MG per tablet 2 tablet (2 tablets Oral Given 01/08/15 1259)    New Prescriptions   HYDROCODONE-ACETAMINOPHEN (NORCO/VICODIN) 5-325 MG PER TABLET    Take 2 tablets by mouth every 4 (four) hours as needed.   IBUPROFEN (ADVIL,MOTRIN) 800 MG TABLET    Take 1 tablet (800 mg total) by mouth 3 (three) times daily.      Noemi Chapel, MD 01/08/15 [phone]

## 2015-04-04 ENCOUNTER — Emergency Department (HOSPITAL_COMMUNITY)
Admission: EM | Admit: 2015-04-04 | Discharge: 2015-04-04 | Disposition: A | Discharge status: Home / Self Care | Payer: BLUE CROSS/BLUE SHIELD | Attending: Emergency Medicine | Admitting: Emergency Medicine

## 2015-04-04 DIAGNOSIS — R591 Generalized enlarged lymph nodes: Secondary | ICD-10-CM

## 2015-04-04 DIAGNOSIS — R0981 Nasal congestion: Secondary | ICD-10-CM | POA: Diagnosis not present

## 2015-04-04 DIAGNOSIS — R61 Generalized hyperhidrosis: Secondary | ICD-10-CM | POA: Insufficient documentation

## 2015-04-04 DIAGNOSIS — J3489 Other specified disorders of nose and nasal sinuses: Secondary | ICD-10-CM | POA: Insufficient documentation

## 2015-04-04 DIAGNOSIS — Z79899 Other long term (current) drug therapy: Secondary | ICD-10-CM | POA: Diagnosis not present

## 2015-04-04 DIAGNOSIS — Z8701 Personal history of pneumonia (recurrent): Secondary | ICD-10-CM | POA: Insufficient documentation

## 2015-04-04 DIAGNOSIS — Z8619 Personal history of other infectious and parasitic diseases: Secondary | ICD-10-CM | POA: Insufficient documentation

## 2015-04-04 DIAGNOSIS — R590 Localized enlarged lymph nodes: Secondary | ICD-10-CM | POA: Insufficient documentation

## 2015-04-04 NOTE — Discharge Instructions (Signed)

## 2015-04-04 NOTE — ED Provider Notes (Signed)
CSN: [id number]     Arrival date & time 04/04/15  1712 History  This chart was scribed for non-physician practitioner, Montine Circle, PA-C working with Lacretia Leigh, MD by Rayna Sexton, ED scribe. This patient was seen in room WTR5/WTR5 and the patient's care was started at 5:29 PM.    Chief Complaint  Patient presents with  . Lymphadenopathy   The history is provided by the patient. No language interpreter was used.    HPI Comments: Kathryn [name] is a 34 y.o. female who presents to the Emergency Department complaining of swelling behind her right ear with onset 5 days ago and worsening symptoms beginning 2 days ago. She notes coming to [facility] initially and testing negative for RMSF and lyme's disease but due to worsening symptoms wants a re-evaluation. She notes night sweats and rhinorrhea as associated symptoms but is unsure of any fever. Pt notes taking 100 mg of doxycycline 2x per day beginning 2 days ago with no relief of symptoms. Pt further notes that she is following up with her PCP as well. She denies any other associated symptoms.   Past Medical History  Diagnosis Date  . PONV (postoperative nausea and vomiting)     cries when awakening  . Clostridium difficile infection 06/2014    not hospitalized  . Pneumonia    Past Surgical History  Procedure Laterality Date  . Cesarean section  2006, 2009    x 2  . Arthroscopic knee  2012  . Mandible fracture surgery  1982    age 72  . Tmj arthroplasty  1996  . Wrist fracture surgery  1998  . Wrist ganglion excision  2011  . Cholecystectomy    . Abdominal hysterectomy  10/2011   Family History  Problem Relation Age of Onset  . Hypertension Father    History  Substance Use Topics  . Smoking status: Never Smoker   . Smokeless tobacco: Not on file  . Alcohol Use: No   OB History    No data available     Review of Systems  Constitutional: Positive for diaphoresis.  HENT: Positive for congestion and  rhinorrhea.       Allergies  Review of patient's allergies indicates no known allergies.  Home Medications   Prior to Admission medications   Medication Sig Start Date End Date Taking? Authorizing Provider  buPROPion (WELLBUTRIN XL) 150 MG 24 hr tablet Take 150 mg by mouth daily.    Historical Provider, MD  citalopram (CELEXA) 10 MG tablet Take 10 mg by mouth daily.    Historical Provider, MD  Guaifenesin-Codeine [facility] C-NR PO) Take by mouth.    Historical Provider, MD  HYDROcodone-acetaminophen (NORCO/VICODIN) 5-325 MG per tablet Take 2 tablets by mouth every 4 (four) hours as needed. 01/08/15   Noemi Chapel, MD  ibuprofen (ADVIL,MOTRIN) 800 MG tablet Take 1 tablet (800 mg total) by mouth 3 (three) times daily. 01/08/15   Noemi Chapel, MD   BP 153/103 mmHg  Pulse 101  Temp(Src) 98.1 F (36.7 C) (Oral)  Resp 16  SpO2 100%  LMP 10/24/2011 Physical Exam  Constitutional: She is oriented to person, place, and time. She appears well-developed and well-nourished. No distress.  HENT:  Head: Normocephalic and atraumatic.  Mouth/Throat: Oropharynx is clear and moist.  Left-sided postauricular lymphadenopathy, left cervical adenopathy  Oropharynx is clear, no tonsillar exudates, uvula is midline, airway intact  Eyes: Conjunctivae and EOM are normal. Pupils are equal, round, and reactive to light.  Neck: Normal  range of motion. Neck supple. No tracheal deviation present.  Cardiovascular: Normal rate, regular rhythm, normal heart sounds and intact distal pulses.  Exam reveals no gallop and no friction rub.   No murmur heard. Pulmonary/Chest: Effort normal and breath sounds normal. No respiratory distress. She has no wheezes. She has no rales. She exhibits no tenderness.  Abdominal: Soft. She exhibits no distension.  Musculoskeletal: Normal range of motion.  Neurological: She is alert and oriented to person, place, and time.  Skin: Skin is warm and dry.  No rash, cellulitis, erythema, or  signs of abscess  Psychiatric: She has a normal mood and affect. Her behavior is normal.  Nursing note and vitals reviewed.   ED Course  Procedures  DIAGNOSTIC STUDIES: Oxygen Saturation is 100% on RA, normal by my interpretation.    COORDINATION OF CARE: 5:33 PM Discussed treatment plan with pt at bedside and pt agreed to plan.  Labs Review Labs Reviewed - No data to display  Imaging Review No results found.   EKG Interpretation None      MDM   Final diagnoses:  Lymphadenopathy    Patient with lymphadenopathy, no evidence of cellulitis, abscess, or other acute infection. Vital signs are stable. Patient has had a negative Lyme and negative rock spotted fever test. Will discharge to home. Recommend primary care follow-up. Patient understands and agrees the plan. She is stable for discharge.  I personally performed the services described in this documentation, which was scribed in my presence. The recorded information has been reviewed and is accurate.     Montine Circle, PA-C 04/04/15 1812  Lacretia Leigh, MD 04/05/15 [phone]

## 2015-04-04 NOTE — ED Notes (Signed)
Pt states that she has lymph nodes that are swollen in the L side of her neck; tested negative for RMSF and lyme's disease. Wants re-eval as she's still hurting. Alert and oriented.

## 2015-08-03 ENCOUNTER — Other Ambulatory Visit: Payer: Self-pay | Admitting: Obstetrics and Gynecology

## 2015-08-05 NOTE — H&P (Signed)
[name], Kathryn [name]         ACCOUNT NO.:  [id number]  MEDICAL RECORD NO.:  [id number]  LOCATION:  PERIO                         FACILITY:  [place]  PHYSICIAN:  Lovenia Kim, M.D.DATE OF BIRTH:  08-Feb-1981  DATE OF ADMISSION:  08/06/2015 DATE OF DISCHARGE:                             HISTORY & PHYSICAL   CHIEF COMPLAINT:  Left lower quadrant pain.  HISTORY OF PRESENT ILLNESS:  She is a 34 year old female, G2, P2, status post hysterectomy in 2013 with persistent left lower quadrant pain, negative GI and GU workup.  ALLERGIES:  She has no known drug allergies.  MEDICATIONS:  None.  FAMILY HISTORY:  Stroke, heart disease, cancer.  PAST SURGICAL HISTORY:  Remarkable for C-section and cholecystectomy, tubal ligation, da Vinci assisted hysterectomy with bilateral salpingectomy in 2013.  PHYSICAL EXAMINATION:  GENERAL:  A well-developed, well-nourished, white female, in no acute distress. HEENT:  Normal. NECK:  Supple.  Full range of motion. LUNGS:  Clear. HEART:  Regular rate and rhythm. ABDOMEN:  Soft, nontender. PELVIC:  Reveals an absent uterus.  Well-healed vagina with well- supported apical cuff and left lower quadrant tenderness on palpation. EXTREMITIES:  There are no cords. NEUROLOGIC:  Nonfocal. SKIN:  Intact.  IMPRESSION:  Left lower quadrant pain with negative GI and GU workup, negative ultrasound.  The patient desires definitive therapy.  PLAN:  Proceed with diagnostic laparoscopy, operative laparoscopy, possible da Vinic assisted left salpingo-oophorectomy, possible lysis of adhesions, possible ablation of endometriosis.  Risks of anesthesia, infection, bleeding, injury to surrounding organs, possible need for repair was discussed, delayed versus immediate complications to include bowel and bladder injury and possible inability to cure pelvic pain has been noted.  The patient acknowledges and wishes to proceed.     Lovenia Kim,  M.D.     RJT/MEDQ  D:  08/05/2015  T:  08/05/2015  Job:  [id number]

## 2015-08-06 ENCOUNTER — Ambulatory Visit (HOSPITAL_COMMUNITY)
Admission: RE | Admit: 2015-08-06 | Discharge: 2015-08-06 | Disposition: A | Discharge status: Home / Self Care | Payer: BLUE CROSS/BLUE SHIELD | Source: Ambulatory Visit | Attending: Obstetrics and Gynecology | Admitting: Obstetrics and Gynecology

## 2015-08-06 ENCOUNTER — Ambulatory Visit (HOSPITAL_COMMUNITY): Payer: BLUE CROSS/BLUE SHIELD | Admitting: Anesthesiology

## 2015-08-06 ENCOUNTER — Encounter (HOSPITAL_COMMUNITY)
Admission: RE | Disposition: A | Discharge status: Home / Self Care | Payer: Self-pay | Source: Ambulatory Visit | Attending: Obstetrics and Gynecology

## 2015-08-06 ENCOUNTER — Encounter (HOSPITAL_COMMUNITY): Payer: Self-pay | Admitting: Anesthesiology

## 2015-08-06 DIAGNOSIS — N83292 Other ovarian cyst, left side: Secondary | ICD-10-CM | POA: Diagnosis not present

## 2015-08-06 DIAGNOSIS — Z6841 Body Mass Index (BMI) 40.0 and over, adult: Secondary | ICD-10-CM | POA: Diagnosis not present

## 2015-08-06 DIAGNOSIS — Z90710 Acquired absence of both cervix and uterus: Secondary | ICD-10-CM | POA: Diagnosis not present

## 2015-08-06 DIAGNOSIS — N736 Female pelvic peritoneal adhesions (postinfective): Secondary | ICD-10-CM | POA: Insufficient documentation

## 2015-08-06 DIAGNOSIS — N801 Endometriosis of ovary: Secondary | ICD-10-CM | POA: Diagnosis not present

## 2015-08-06 DIAGNOSIS — R1032 Left lower quadrant pain: Secondary | ICD-10-CM | POA: Insufficient documentation

## 2015-08-06 HISTORY — PX: ROBOTIC ASSISTED DIAGNOSTIC LAPAROSCOPY: SHX6532

## 2015-08-06 LAB — CBC
HCT: 44.9 % (ref 36.0–46.0)
Hemoglobin: 14.1 g/dL (ref 12.0–15.0)
MCH: 27 pg (ref 26.0–34.0)
MCHC: 31.4 g/dL (ref 30.0–36.0)
MCV: 86 fL (ref 78.0–100.0)
PLATELETS: 283 10*3/uL (ref 150–400)
RBC: 5.22 MIL/uL — ABNORMAL HIGH (ref 3.87–5.11)
RDW: 14.2 % (ref 11.5–15.5)
WBC: 8.4 10*3/uL (ref 4.0–10.5)

## 2015-08-06 SURGERY — ROBOTIC ASSISTED DIAGNOSTIC LAPAROSCOPY
Anesthesia: General | Site: Abdomen | Laterality: Left

## 2015-08-06 MED ORDER — LACTATED RINGERS IR SOLN
Status: DC | PRN
  Administered 2015-08-06: 3000 mL

## 2015-08-06 MED ORDER — MIDAZOLAM HCL 2 MG/2ML IJ SOLN
INTRAMUSCULAR | Status: DC | PRN
  Administered 2015-08-06: 2 mg via INTRAVENOUS

## 2015-08-06 MED ORDER — SCOPOLAMINE 1 MG/3DAYS TD PT72
1.0000 | MEDICATED_PATCH | Freq: Once | TRANSDERMAL | Status: DC
  Administered 2015-08-06: 1.5 mg via TRANSDERMAL

## 2015-08-06 MED ORDER — PHENYLEPHRINE 40 MCG/ML (10ML) SYRINGE FOR IV PUSH (FOR BLOOD PRESSURE SUPPORT)
PREFILLED_SYRINGE | INTRAVENOUS | Status: AC
  Filled 2015-08-06: qty 10

## 2015-08-06 MED ORDER — ROPIVACAINE HCL 5 MG/ML IJ SOLN
INTRAMUSCULAR | Status: AC
  Filled 2015-08-06: qty 60

## 2015-08-06 MED ORDER — DEXAMETHASONE SODIUM PHOSPHATE 4 MG/ML IJ SOLN
INTRAMUSCULAR | Status: AC
  Filled 2015-08-06: qty 1

## 2015-08-06 MED ORDER — BUPIVACAINE HCL (PF) 0.25 % IJ SOLN
INTRAMUSCULAR | Status: DC | PRN
  Administered 2015-08-06: 10 mL

## 2015-08-06 MED ORDER — LIDOCAINE HCL (CARDIAC) 20 MG/ML IV SOLN
INTRAVENOUS | Status: AC
  Filled 2015-08-06: qty 5

## 2015-08-06 MED ORDER — ROCURONIUM BROMIDE 100 MG/10ML IV SOLN
INTRAVENOUS | Status: DC | PRN
  Administered 2015-08-06: 30 mg via INTRAVENOUS
  Administered 2015-08-06: 50 mg via INTRAVENOUS

## 2015-08-06 MED ORDER — KETOROLAC TROMETHAMINE 30 MG/ML IJ SOLN
INTRAMUSCULAR | Status: AC
  Filled 2015-08-06: qty 1

## 2015-08-06 MED ORDER — FENTANYL CITRATE (PF) 100 MCG/2ML IJ SOLN
INTRAMUSCULAR | Status: DC | PRN
  Administered 2015-08-06: 100 ug via INTRAVENOUS
  Administered 2015-08-06: 50 ug via INTRAVENOUS
  Administered 2015-08-06: 100 ug via INTRAVENOUS

## 2015-08-06 MED ORDER — MEPERIDINE HCL 25 MG/ML IJ SOLN: 6.2500 mg | INTRAMUSCULAR | Status: DC | PRN

## 2015-08-06 MED ORDER — ONDANSETRON HCL 4 MG/2ML IJ SOLN
INTRAMUSCULAR | Status: DC | PRN
  Administered 2015-08-06: 4 mg via INTRAVENOUS

## 2015-08-06 MED ORDER — BUPIVACAINE LIPOSOME 1.3 % IJ SUSP
INTRAMUSCULAR | Status: DC | PRN
  Administered 2015-08-06: 20 mL

## 2015-08-06 MED ORDER — ONDANSETRON HCL 4 MG/2ML IJ SOLN
INTRAMUSCULAR | Status: AC
  Filled 2015-08-06: qty 2

## 2015-08-06 MED ORDER — LIDOCAINE HCL (CARDIAC) 20 MG/ML IV SOLN
INTRAVENOUS | Status: DC | PRN
  Administered 2015-08-06: 100 mg via INTRAVENOUS

## 2015-08-06 MED ORDER — NEOSTIGMINE METHYLSULFATE 10 MG/10ML IV SOLN
INTRAVENOUS | Status: DC | PRN
  Administered 2015-08-06: 3 mg via INTRAVENOUS

## 2015-08-06 MED ORDER — SODIUM CHLORIDE 0.9 % IJ SOLN
INTRAMUSCULAR | Status: AC
  Filled 2015-08-06: qty 50

## 2015-08-06 MED ORDER — LACTATED RINGERS IV SOLN
INTRAVENOUS | Status: DC | PRN
  Administered 2015-08-06 (×2): via INTRAVENOUS

## 2015-08-06 MED ORDER — NEOSTIGMINE METHYLSULFATE 10 MG/10ML IV SOLN
INTRAVENOUS | Status: AC
  Filled 2015-08-06: qty 1

## 2015-08-06 MED ORDER — CEFAZOLIN SODIUM-DEXTROSE 2-3 GM-% IV SOLR
INTRAVENOUS | Status: AC
  Filled 2015-08-06: qty 50

## 2015-08-06 MED ORDER — SODIUM CHLORIDE 0.9 % IJ SOLN
INTRAMUSCULAR | Status: AC
  Filled 2015-08-06: qty 10

## 2015-08-06 MED ORDER — BUPIVACAINE HCL (PF) 0.25 % IJ SOLN
INTRAMUSCULAR | Status: AC
  Filled 2015-08-06: qty 10

## 2015-08-06 MED ORDER — HYDROCODONE-IBUPROFEN 7.5-200 MG PO TABS: 1.0000 | ORAL_TABLET | Freq: Three times a day (TID) | ORAL | Status: AC | PRN

## 2015-08-06 MED ORDER — PROPOFOL 10 MG/ML IV BOLUS
INTRAVENOUS | Status: AC
  Filled 2015-08-06: qty 20

## 2015-08-06 MED ORDER — GLYCOPYRROLATE 0.2 MG/ML IJ SOLN
INTRAMUSCULAR | Status: DC | PRN
  Administered 2015-08-06: 0.6 mg via INTRAVENOUS

## 2015-08-06 MED ORDER — KETOROLAC TROMETHAMINE 30 MG/ML IJ SOLN
INTRAMUSCULAR | Status: DC | PRN
  Administered 2015-08-06: 30 mg via INTRAVENOUS

## 2015-08-06 MED ORDER — LACTATED RINGERS IV SOLN
INTRAVENOUS | Status: DC
  Administered 2015-08-06: 13:00:00 via INTRAVENOUS

## 2015-08-06 MED ORDER — ONDANSETRON HCL 4 MG/2ML IJ SOLN: 4.0000 mg | Freq: Once | INTRAMUSCULAR | Status: DC | PRN

## 2015-08-06 MED ORDER — PHENYLEPHRINE 40 MCG/ML (10ML) SYRINGE FOR IV PUSH (FOR BLOOD PRESSURE SUPPORT)
PREFILLED_SYRINGE | INTRAVENOUS | Status: DC | PRN
  Administered 2015-08-06: 80 ug via INTRAVENOUS
  Administered 2015-08-06: 40 ug via INTRAVENOUS
  Administered 2015-08-06: 80 ug via INTRAVENOUS

## 2015-08-06 MED ORDER — MIDAZOLAM HCL 2 MG/2ML IJ SOLN
INTRAMUSCULAR | Status: AC
  Filled 2015-08-06: qty 4

## 2015-08-06 MED ORDER — GLYCOPYRROLATE 0.2 MG/ML IJ SOLN
INTRAMUSCULAR | Status: AC
  Filled 2015-08-06: qty 3

## 2015-08-06 MED ORDER — FENTANYL CITRATE (PF) 100 MCG/2ML IJ SOLN: 25.0000 ug | INTRAMUSCULAR | Status: DC | PRN

## 2015-08-06 MED ORDER — KETOROLAC TROMETHAMINE 30 MG/ML IJ SOLN: 30.0000 mg | Freq: Once | INTRAMUSCULAR | Status: AC | PRN

## 2015-08-06 MED ORDER — BUPIVACAINE LIPOSOME 1.3 % IJ SUSP
20.0000 mL | Freq: Once | INTRAMUSCULAR | Status: DC
  Filled 2015-08-06: qty 20

## 2015-08-06 MED ORDER — SCOPOLAMINE 1 MG/3DAYS TD PT72
MEDICATED_PATCH | TRANSDERMAL | Status: AC
  Filled 2015-08-06: qty 1

## 2015-08-06 MED ORDER — CEFAZOLIN SODIUM-DEXTROSE 2-3 GM-% IV SOLR
2.0000 g | Freq: Once | INTRAVENOUS | Status: AC
  Administered 2015-08-06: 2 g via INTRAVENOUS

## 2015-08-06 MED ORDER — FENTANYL CITRATE (PF) 250 MCG/5ML IJ SOLN
INTRAMUSCULAR | Status: AC
  Filled 2015-08-06: qty 25

## 2015-08-06 MED ORDER — PROPOFOL 10 MG/ML IV BOLUS
INTRAVENOUS | Status: DC | PRN
  Administered 2015-08-06: 200 mg via INTRAVENOUS

## 2015-08-06 MED ORDER — DEXAMETHASONE SODIUM PHOSPHATE 10 MG/ML IJ SOLN
INTRAMUSCULAR | Status: DC | PRN
  Administered 2015-08-06: 4 mg via INTRAVENOUS

## 2015-08-06 MED ORDER — ROCURONIUM BROMIDE 100 MG/10ML IV SOLN
INTRAVENOUS | Status: AC
  Filled 2015-08-06: qty 1

## 2015-08-06 SURGICAL SUPPLY — 69 items
BAG SPEC RTRVL LRG 6X4 10 (ENDOMECHANICALS) ×2
BARRIER ADHS 3X4 INTERCEED (GAUZE/BANDAGES/DRESSINGS) IMPLANT
BRR ADH 4X3 ABS CNTRL BYND (GAUZE/BANDAGES/DRESSINGS)
CABLE HIGH FREQUENCY MONO STRZ (ELECTRODE) IMPLANT
CATH FOLEY 3WAY  5CC 16FR (CATHETERS)
CATH FOLEY 3WAY 5CC 16FR (CATHETERS) ×1 IMPLANT
CATH ROBINSON RED A/P 16FR (CATHETERS) IMPLANT
CHLORAPREP W/TINT 26ML (MISCELLANEOUS) ×3 IMPLANT
CLOTH BEACON ORANGE TIMEOUT ST (SAFETY) ×3 IMPLANT
CONT PATH 16OZ SNAP LID 3702 (MISCELLANEOUS) ×3 IMPLANT
COVER BACK TABLE 60X90IN (DRAPES) ×6 IMPLANT
COVER TIP SHEARS 8 DVNC (MISCELLANEOUS) ×3 IMPLANT
COVER TIP SHEARS 8MM DA VINCI (MISCELLANEOUS) ×2
DECANTER SPIKE VIAL GLASS SM (MISCELLANEOUS) ×3 IMPLANT
DRAPE WARM FLUID 44X44 (DRAPE) ×3 IMPLANT
DRSG COVADERM PLUS 2X2 (GAUZE/BANDAGES/DRESSINGS) ×12 IMPLANT
DRSG OPSITE POSTOP 3X4 (GAUZE/BANDAGES/DRESSINGS) ×3 IMPLANT
ELECT REM PT RETURN 9FT ADLT (ELECTROSURGICAL) ×3
ELECTRODE REM PT RTRN 9FT ADLT (ELECTROSURGICAL) ×2 IMPLANT
FORCEPS CUTTING 33CM 5MM (CUTTING FORCEPS) IMPLANT
FORCEPS CUTTING 45CM 5MM (CUTTING FORCEPS) IMPLANT
GAUZE VASELINE 3X9 (GAUZE/BANDAGES/DRESSINGS) IMPLANT
GLOVE BIO SURGEON STRL SZ7.5 (GLOVE) ×6 IMPLANT
GOWN STRL REUS W/TWL LRG LVL3 (GOWN DISPOSABLE) ×3 IMPLANT
KIT ACCESSORY DA VINCI DISP (KITS) ×1
KIT ACCESSORY DVNC DISP (KITS) ×2 IMPLANT
LEGGING LITHOTOMY PAIR STRL (DRAPES) ×3 IMPLANT
LIQUID BAND (GAUZE/BANDAGES/DRESSINGS) ×3 IMPLANT
NEEDLE HYPO 22GX1.5 SAFETY (NEEDLE) ×3 IMPLANT
NEEDLE INSUFFLATION 120MM (ENDOMECHANICALS) ×3 IMPLANT
NEEDLE INSUFFLATION 150MM (ENDOMECHANICALS) ×3 IMPLANT
PACK LAPAROSCOPY BASIN (CUSTOM PROCEDURE TRAY) ×3 IMPLANT
PACK ROBOT WH (CUSTOM PROCEDURE TRAY) ×3 IMPLANT
PACK ROBOTIC GOWN (GOWN DISPOSABLE) ×3 IMPLANT
PAD POSITIONING PINK XL (MISCELLANEOUS) ×3 IMPLANT
PAD PREP 24X48 CUFFED NSTRL (MISCELLANEOUS) ×6 IMPLANT
POUCH SPECIMEN RETRIEVAL 10MM (ENDOMECHANICALS) ×2 IMPLANT
SCISSORS LAP 5X35 DISP (ENDOMECHANICALS) ×2 IMPLANT
SET CYSTO W/LG BORE CLAMP LF (SET/KITS/TRAYS/PACK) IMPLANT
SET IRRIG TUBING LAPAROSCOPIC (IRRIGATION / IRRIGATOR) ×3 IMPLANT
SET TRI-LUMEN FLTR TB AIRSEAL (TUBING) ×4 IMPLANT
SLEEVE XCEL OPT CAN 5 100 (ENDOMECHANICALS) IMPLANT
SOLUTION ELECTROLUBE (MISCELLANEOUS) ×3 IMPLANT
SUT VIC AB 0 CT1 27 (SUTURE) ×6
SUT VIC AB 0 CT1 27XBRD ANBCTR (SUTURE) ×4 IMPLANT
SUT VIC AB 0 CT1 27XBRD ANTBC (SUTURE) IMPLANT
SUT VICRYL 0 UR6 27IN ABS (SUTURE) ×3 IMPLANT
SUT VICRYL 4-0 PS2 18IN ABS (SUTURE) ×6 IMPLANT
SUT VICRYL RAPIDE 4/0 PS 2 (SUTURE) ×6 IMPLANT
SYR 50ML LL SCALE MARK (SYRINGE) ×3 IMPLANT
SYR CONTROL 10ML LL (SYRINGE) ×3 IMPLANT
SYRINGE 10CC LL (SYRINGE) ×3 IMPLANT
SYSTEM CONVERTIBLE TROCAR (TROCAR) IMPLANT
TIP UTERINE 5.1X6CM LAV DISP (MISCELLANEOUS) IMPLANT
TIP UTERINE 6.7X10CM GRN DISP (MISCELLANEOUS) IMPLANT
TIP UTERINE 6.7X6CM WHT DISP (MISCELLANEOUS) IMPLANT
TIP UTERINE 6.7X8CM BLUE DISP (MISCELLANEOUS) IMPLANT
TOWEL OR 17X24 6PK STRL BLUE (TOWEL DISPOSABLE) ×9 IMPLANT
TRAY FOLEY CATH SILVER 14FR (SET/KITS/TRAYS/PACK) ×3 IMPLANT
TROCAR DISP BLADELESS 8 DVNC (TROCAR) ×3 IMPLANT
TROCAR DISP BLADELESS 8MM (TROCAR) ×2
TROCAR OPTI TIP 5M 100M (ENDOMECHANICALS) IMPLANT
TROCAR PORT AIRSEAL 5X120 (TROCAR) ×2 IMPLANT
TROCAR XCEL 12X100 BLDLESS (ENDOMECHANICALS) IMPLANT
TROCAR XCEL DIL TIP R 11M (ENDOMECHANICALS) ×3 IMPLANT
TROCAR XCEL NON-BLD 5MMX100MML (ENDOMECHANICALS) ×3 IMPLANT
TROCAR Z-THREAD 12X150 (TROCAR) ×3 IMPLANT
WARMER LAPAROSCOPE (MISCELLANEOUS) ×3 IMPLANT
WATER STERILE IRR 1000ML POUR (IV SOLUTION) ×9 IMPLANT

## 2015-08-06 NOTE — Anesthesia Procedure Notes (Signed)
Procedure Name: Intubation Date/Time: 08/06/2015 2:06 PM Performed by: Brock Ra Pre-anesthesia Checklist: Patient identified, Emergency Drugs available, Suction available, Patient being monitored and Timeout performed Patient Re-evaluated:Patient Re-evaluated prior to inductionOxygen Delivery Method: Circle system utilized Preoxygenation: Pre-oxygenation with 100% oxygen Intubation Type: IV induction Ventilation: Mask ventilation without difficulty Laryngoscope Size: Mac and 3 (Attepted with no visualization trachea. Decision to use glidescope) Grade View: Grade III Tube type: Oral Tube size: 7.0 mm Number of attempts: 2 Airway Equipment and Method: Video-laryngoscopy and Stylet Placement Confirmation: ETT inserted through vocal cords under direct vision,  positive ETCO2 and breath sounds checked- equal and bilateral Secured at: 20 (20 at lips) cm Tube secured with: Tape Dental Injury: Teeth and Oropharynx as per pre-operative assessment  Difficulty Due To: Difficulty was unanticipated Comments: No visualization with first DL. Decision to use Glidescope due to small oral opening and thick  airway tissue

## 2015-08-06 NOTE — Transfer of Care (Signed)
Immediate Anesthesia Transfer of Care Note  Patient: Kathryn [name]  Procedure(s) Performed: Procedure(s): ROBOTIC ASSISTED DIAGNOSTIC LAPAROSCOPY with lysis of adhesions; left oophorectomy (Left)  Patient Location: PACU  Anesthesia Type:General  Level of Consciousness: awake, alert , oriented and patient cooperative  Airway & Oxygen Therapy: Patient Spontanous Breathing and Patient connected to nasal cannula oxygen  Post-op Assessment: Report given to RN and Post -op Vital signs reviewed and stable  Post vital signs: Reviewed and stable  Last Vitals:  TEMP 97.7 BP 139/80 HR 79 RR 12 POX 791  Complications: No apparent anesthesia complications

## 2015-08-06 NOTE — Discharge Instructions (Signed)
DISCHARGE INSTRUCTIONS: Laparoscopy   Can take ibuprofen/motrin/advial starting at 10PM  On 08/06/15 Increase water intake the next 48 hours Can use a heating pad to abd and upper chest.  The following instructions have been prepared to help you care for yourself upon your return home today.  Wound care:  Do not get the incision wet for the first 24 hours. The incision should be kept clean and dry.  The Band-Aids or dressings may be removed the day after surgery.  Should the incision become sore, red, and swollen after the first week, check with your doctor.  Personal hygiene:  Shower the day after your procedure.  Activity and limitations:  Do NOT drive or operate any equipment today.  Do NOT lift anything more than 15 pounds for 2-3 weeks after surgery.  Do NOT rest in bed all day.  Walking is encouraged. Walk each day, starting slowly with 5-minute walks 3 or 4 times a day. Slowly increase the length of your walks.  Walk up and down stairs slowly.  Do NOT do strenuous activities, such as golfing, playing tennis, bowling, running, biking, weight lifting, gardening, mowing, or vacuuming for 2-4 weeks. Ask your doctor when it is okay to start.  Diet: Eat a light meal as desired this evening. You may resume your usual diet tomorrow.  Return to work: This is dependent on the type of work you do. For the most part you can return to a desk job within a week of surgery. If you are more active at work, please discuss this with your doctor.  What to expect after your surgery: You may have a slight burning sensation when you urinate on the first day. You may have a very small amount of blood in the urine. Expect to have a small amount of vaginal discharge/light bleeding for 1-2 weeks. It is not unusual to have abdominal soreness and bruising for up to 2 weeks. You may be tired and need more rest for about 1 week. You may experience shoulder pain for 24-72 hours. Lying flat in bed may  relieve it.  Call your doctor for any of the following:  Develop a fever of 100.4 or greater  Inability to urinate 6 hours after discharge from hospital  Severe pain not relieved by pain medications  Persistent of heavy bleeding at incision site  Redness or swelling around incision site after a week  Increasing nausea or vomiting  Patient Signature________________________________________ Nurse Signature_________________________________________

## 2015-08-06 NOTE — Anesthesia Postprocedure Evaluation (Signed)
Anesthesia Post Note  Patient: Kathryn [name]  Procedure(s) Performed: Procedure(s) (LRB): ROBOTIC ASSISTED DIAGNOSTIC LAPAROSCOPY with lysis of adhesions; left oophorectomy (Left)  Anesthesia type: General  Patient location: PACU  Post pain: Pain level controlled  Post assessment: Post-op Vital signs reviewed  Last Vitals:  Filed Vitals:   08/06/15 1715  BP: 122/80  Pulse: 83  Temp: 36.7 C  Resp: 18    Post vital signs: Reviewed  Level of consciousness: sedated  Complications: No apparent anesthesia complications

## 2015-08-06 NOTE — Op Note (Signed)
08/06/2015  4:02 PM  PATIENT:  Kathryn [name]  34 y.o. 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 Omental adhesiions to anterior abdominal wall  PROCEDURE:  Procedure(s): ROBOTIC ASSISTED OPERATIVE LAPAROSCOPY lysis of DENSE RECTO SIGMOID LEFT ADNEXAL AND OMENTAL adhesions; left oophorectomy, EXCISION AND ABLATION OF ENDOMETRIOSIS  SURGEON:  Surgeon(s): Brien Few, MD  ASSISTANTSMel Almond, CNM   ANESTHESIA:   local and general  ESTIMATED BLOOD LOSS: MINIMAL  DRAINS: Urinary Catheter (Foley)   LOCAL MEDICATIONS USED:  MARCAINE    and Amount: 30 ml  SPECIMEN:  Source of Specimen:  LEFT OVARY, ENDOMETRIOTIC IMPLANT  DISPOSITION OF SPECIMEN:  PATHOLOGY  COUNTS:  YES  DICTATION #: [id number]  PLAN OF CARE: DC HOME  PATIENT DISPOSITION:  PACU - hemodynamically stable.

## 2015-08-06 NOTE — Anesthesia Preprocedure Evaluation (Addendum)
Anesthesia Evaluation  Patient identified by MRN, date of birth, ID band  Reviewed: Allergy & Precautions, H&P , NPO status , Patient's Chart, lab work & pertinent test results  History of Anesthesia Complications (+) PONV and history of anesthetic complications  Airway Mallampati: II  TM Distance: >3 FB Neck ROM: full    Dental no notable dental hx. (+) Teeth Intact   Pulmonary    Pulmonary exam normal        Cardiovascular negative cardio ROS Normal cardiovascular exam     Neuro/Psych negative neurological ROS  negative psych ROS   GI/Hepatic negative GI ROS, Neg liver ROS,   Endo/Other  Morbid obesity  Renal/GU negative Renal ROS     Musculoskeletal   Abdominal (+) + obese,   Peds  Hematology negative hematology ROS (+)   Anesthesia Other Findings   Reproductive/Obstetrics negative OB ROS                            Anesthesia Physical Anesthesia Plan  ASA: III  Anesthesia Plan: General   Post-op Pain Management:    Induction: Intravenous  Airway Management Planned: Oral ETT  Additional Equipment:   Intra-op Plan:   Post-operative Plan: Extubation in OR  Informed Consent: I have reviewed the patients History and Physical, chart, labs and discussed the procedure including the risks, benefits and alternatives for the proposed anesthesia with the patient or authorized representative who has indicated his/her understanding and acceptance.   Dental Advisory Given  Plan Discussed with: CRNA and Surgeon  Anesthesia Plan Comments:         Anesthesia Quick Evaluation

## 2015-08-06 NOTE — Progress Notes (Signed)
Patient ID: Kathryn [name], female   DOB: [birth year], 34 y.o.   MRN: [id number] Patient seen and examined. Consent witnessed and signed. No changes noted. Update completed.

## 2015-08-06 NOTE — OR Nursing (Signed)
L Speight CRNA made aware PIV was postional and said it will be handled in the OR, Kristine Royal, RN

## 2015-08-07 ENCOUNTER — Encounter (HOSPITAL_COMMUNITY): Payer: Self-pay | Admitting: Obstetrics and Gynecology

## 2015-08-07 NOTE — Op Note (Signed)
Kathryn [name], Kathryn [name]         ACCOUNT NO.:  [id number]  MEDICAL RECORD NO.:  [id number]  LOCATION:  [facility]                          FACILITY:  [facility]  PHYSICIAN:  Lovenia Kim, M.D.DATE OF BIRTH:  December 10, 1980  DATE OF PROCEDURE: DATE OF DISCHARGE:                              OPERATIVE REPORT   PREOPERATIVE DIAGNOSIS:  Left lower quadrant pain.  POSTOPERATIVE DIAGNOSES:  Left ovarian cyst, pelvic endometriosis, left adhesions of the sigmoid colon to the left adnexa, adhesions of the omentum to the anterior abdominal wall.  PROCEDURE:  Da Vinci assisted left oophorectomy, lysis of significant adhesions, ablation and excision of pelvic endometriosis.  SURGEON:  Lovenia Kim, M.D.  ASSISTANT:  Mel Almond.  ANESTHESIA:  General and local.  ESTIMATED BLOOD LOSS:  Less than 50 mL.  COMPLICATIONS:  None.  DRAINS:  Foley.  COUNTS:  Correct.  DISPOSITION:  The patient to recovery in good condition.  BRIEF OPERATIVE NOTE:  After being apprised of the risks of anesthesia, infection, bleeding, injury to surrounding organs, possible need for repair, delayed versus immediate complications to include bowel and bladder injury, the patient was brought to the operating room where she was administered general anesthetic without complications.  Prepped and draped in usual sterile fashion.  Foley catheter placed.  Exam under anesthesia reveals an absent uterus and no adnexal masses. Infraumbilical incision made with a scalpel.  Veress needle placed, opening pressure of -2.  3 liters of CO2 insufflated without difficulty. Trocar placed atraumatically.  Visualization reveals some omental adhesions to the base of the trocar site.  There was a clear area on the right lower quadrant and also clear on the left lower quadrant.  No bowel appears to be involved, so right lower quadrant incision and left lower quadrant incision were made.  8 mm trocars were placed.  Endo Shears were placed,  lysing sharply the dense omental adhesions to the anterior abdominal wall which were completely cleared from the periumbilical area.  At this time, visualization of the pelvis reveals dense adhesions of the bowel to the left adnexa.  The filmy adhesions were lysed, but the left ovary is adherent into the pelvic brim and unable to delineate a clear plane between the bowel and the left adnexal brim to trace out the left ovary, thereby the robot was then docked in a standard fashion.  Endo Shears, PK forceps were placed.  Lysis of significant sigmoid adhesions to the left adnexa were performed through retroperitoneal entry.  Ureter was identified and the left adnexa was exposed.  There was evidence of endometriosis along the base of the left ovary which was excised and sent with the specimen to be removed.  At this time, infundibulopelvic ligament was skeletonized, lysing dense adhesions into the left ovarian fossa and the left ureter was identified.  The left infundibulopelvic ligament was cauterized using bipolar cautery and the ovary was detached.  Along the right ovary, adhesions are excised as well, freeing up the right ovary which appears otherwise normal.  The robot was then undocked.  The trocar 8 mm camera was placed to the right accessory trocar and the specimen was placed into an EndoCatch bag, retracted through the midline umbilical incision without difficulty and  sent to Pathology for permanent section.  Good hemostasis noted.  Irrigation was accomplished.  All instruments removed under direct visualization.  CO2 released.  This portion of the procedure was then terminated where all incisions are closed using 0 Vicryl, 4-0 Vicryl, and Dermabond.  Sponge stick removed from the vagina.  Foley catheter removed.  The patient tolerated the procedure well.  Exparel, Marcaine solution placed in all incisions.  The patient was transferred to recovery in good condition.     Lovenia Kim, M.D.     RJT/MEDQ  D:  08/06/2015  T:  08/07/2015  Job:  [id number]  cc:   Lovenia Kim, M.D. Fax: [phone]

## 2017-06-29 ENCOUNTER — Emergency Department (HOSPITAL_BASED_OUTPATIENT_CLINIC_OR_DEPARTMENT_OTHER): Payer: Self-pay

## 2017-06-29 ENCOUNTER — Encounter (HOSPITAL_BASED_OUTPATIENT_CLINIC_OR_DEPARTMENT_OTHER): Payer: Self-pay | Admitting: *Deleted

## 2017-06-29 ENCOUNTER — Emergency Department (HOSPITAL_BASED_OUTPATIENT_CLINIC_OR_DEPARTMENT_OTHER)
Admission: EM | Admit: 2017-06-29 | Discharge: 2017-06-29 | Disposition: A | Discharge status: Home / Self Care | Payer: Self-pay | Attending: Emergency Medicine | Admitting: Emergency Medicine

## 2017-06-29 DIAGNOSIS — R1084 Generalized abdominal pain: Secondary | ICD-10-CM | POA: Insufficient documentation

## 2017-06-29 DIAGNOSIS — K625 Hemorrhage of anus and rectum: Secondary | ICD-10-CM

## 2017-06-29 LAB — COMPREHENSIVE METABOLIC PANEL
ALK PHOS: 61 U/L (ref 38–126)
ALT: 99 U/L — AB (ref 14–54)
AST: 76 U/L — AB (ref 15–41)
Albumin: 4.4 g/dL (ref 3.5–5.0)
Anion gap: 8 (ref 5–15)
BILIRUBIN TOTAL: 0.7 mg/dL (ref 0.3–1.2)
BUN: 15 mg/dL (ref 6–20)
CALCIUM: 9.2 mg/dL (ref 8.9–10.3)
CO2: 22 mmol/L (ref 22–32)
CREATININE: 0.94 mg/dL (ref 0.44–1.00)
Chloride: 105 mmol/L (ref 101–111)
GFR calc Af Amer: >60 mL/min (ref >60)
Glucose, Bld: 82 mg/dL (ref 65–99)
POTASSIUM: 3.9 mmol/L (ref 3.5–5.1)
Sodium: 135 mmol/L (ref 135–145)
TOTAL PROTEIN: 7.8 g/dL (ref 6.5–8.1)

## 2017-06-29 LAB — URINALYSIS, ROUTINE W REFLEX MICROSCOPIC
Glucose, UA: NEGATIVE mg/dL
Hgb urine dipstick: NEGATIVE
Ketones, ur: NEGATIVE mg/dL
Leukocytes, UA: NEGATIVE
Nitrite: NEGATIVE
Protein, ur: NEGATIVE mg/dL
Specific Gravity, Urine: 1.025 (ref 1.005–1.030)
pH: 6 (ref 5.0–8.0)

## 2017-06-29 LAB — CBC
HEMATOCRIT: 44.2 % (ref 36.0–46.0)
Hemoglobin: 14.3 g/dL (ref 12.0–15.0)
MCH: 27.9 pg (ref 26.0–34.0)
MCHC: 32.4 g/dL (ref 30.0–36.0)
MCV: 86.3 fL (ref 78.0–100.0)
PLATELETS: 241 10*3/uL (ref 150–400)
RBC: 5.12 MIL/uL — ABNORMAL HIGH (ref 3.87–5.11)
RDW: 14.3 % (ref 11.5–15.5)
WBC: 10.1 10*3/uL (ref 4.0–10.5)

## 2017-06-29 LAB — PREGNANCY, URINE: Preg Test, Ur: NEGATIVE

## 2017-06-29 LAB — OCCULT BLOOD X 1 CARD TO LAB, STOOL: FECAL OCCULT BLD: POSITIVE — AB

## 2017-06-29 MED ORDER — SODIUM CHLORIDE 0.9 % IV BOLUS (SEPSIS)
1000.0000 mL | Freq: Once | INTRAVENOUS | Status: AC
  Administered 2017-06-29: 1000 mL via INTRAVENOUS

## 2017-06-29 MED ORDER — PANTOPRAZOLE SODIUM 20 MG PO TBEC: 20.0000 mg | DELAYED_RELEASE_TABLET | Freq: Every day | ORAL | 0 refills | Status: AC

## 2017-06-29 MED ORDER — IOPAMIDOL (ISOVUE-300) INJECTION 61%
100.0000 mL | Freq: Once | INTRAVENOUS | Status: AC | PRN
  Administered 2017-06-29: 100 mL via INTRAVENOUS

## 2017-06-29 NOTE — Discharge Instructions (Signed)
Return here as needed.  Follow-up with the GI Dr. provided. You can also follow-up with your primary doctor in the interim

## 2017-06-29 NOTE — ED Notes (Signed)
Pt verbalizes understanding of d/c instructions and denies any further needs at this time. 

## 2017-06-29 NOTE — ED Notes (Signed)
Patient transported to CT 

## 2017-06-29 NOTE — ED Triage Notes (Signed)
Rectal bleeding with her bowel movements x 3 days. She is having more frequent bowel movements than normal for her. Pain in her left quadrant. She feels she has diverticulitis.

## 2017-06-29 NOTE — ED Notes (Signed)
ED Provider at bedside. 

## 2017-07-03 NOTE — ED Provider Notes (Signed)
[facility] DEPT Provider Note   CSN: [id number] Arrival date & time: 06/29/17  1805     History   Chief Complaint Chief Complaint  Patient presents with  . Rectal Bleeding    HPI Kathryn [name] is a 36 y.o. female.  HPI Patient presents to the emergency department with rectal bleeding.  This been ongoing over the last 3 days.  The patient states that she is having more frequent bowel movements than normal and left lower quadrant pain.  Patient states she is worried she has diverticulitis.  Patient states that the bleeding is not large quantities nothing seems make this better or worse.  She states she did not take any medications prior to arrival. The patient denies chest pain, shortness of breath, headache,blurred vision, neck pain, fever, cough, weakness, numbness, dizziness, anorexia, edema, abdominal pain, nausea, vomiting, diarrhea, rash, back pain, dysuria, hematemesis, near syncope, or syncope. Past Medical History:  Diagnosis Date  . Clostridium difficile infection 06/2014   not hospitalized  . Pneumonia   . PONV (postoperative nausea and vomiting)    cries when awakening    Patient Active Problem List   Diagnosis Date Noted  . HYPERTHYROIDISM 07/05/2007  . VITAMIN B12 DEFICIENCY 07/05/2007  . ALLERGIC RHINITIS 07/05/2007  . MENOPAUSAL SYNDROME 07/05/2007    Past Surgical History:  Procedure Laterality Date  . ABDOMINAL HYSTERECTOMY  10/2011  . arthroscopic knee  2012  . CESAREAN SECTION  2006, 2009   x 2  . CHOLECYSTECTOMY    . MANDIBLE FRACTURE SURGERY  1982   age 34  . ROBOTIC ASSISTED DIAGNOSTIC LAPAROSCOPY Left 08/06/2015   Procedure: ROBOTIC ASSISTED DIAGNOSTIC LAPAROSCOPY with lysis of adhesions; left oophorectomy;  Surgeon: Brien Few, MD;  Location: [facility];  Service: Gynecology;  Laterality: Left;  . TMJ ARTHROPLASTY  1996  . WRIST FRACTURE SURGERY  1998  . WRIST GANGLION EXCISION  2011    OB History    No data available        Home Medications    Prior to Admission medications   Medication Sig Start Date End Date Taking? Authorizing Provider  HYDROcodone-ibuprofen (VICOPROFEN) 7.5-200 MG tablet Take 1 tablet by mouth every 8 (eight) hours as needed for moderate pain. 08/06/15   Brien Few, MD  pantoprazole (PROTONIX) 20 MG tablet Take 1 tablet (20 mg total) by mouth daily. 06/29/17   Dalia Heading, PA-C    Family History Family History  Problem Relation Age of Onset  . Hypertension Father     Social History Social History  Substance Use Topics  . Smoking status: Never Smoker  . Smokeless tobacco: Never Used  . Alcohol use No     Allergies   Patient has no known allergies.   Review of Systems Review of Systems All other systems negative except as documented in the HPI. All pertinent positives and negatives as reviewed in the HPI.  Physical Exam Updated Vital Signs BP 128/86 (BP Location: Left Arm)   Pulse 80   Temp 98.3 F (36.8 C) (Oral)   Resp 18   Ht 5\' 5"  (1.651 m)   Wt 108.9 kg (240 lb)   LMP 10/24/2011   SpO2 100%   BMI 39.94 kg/m   Physical Exam  Constitutional: She is oriented to person, place, and time. She appears well-developed and well-nourished. No distress.  HENT:  Head: Normocephalic and atraumatic.  Mouth/Throat: Oropharynx is clear and moist.  Eyes: Pupils are equal, round, and reactive to light.  Neck: Normal  range of motion. Neck supple.  Cardiovascular: Normal rate, regular rhythm and normal heart sounds.  Exam reveals no gallop and no friction rub.   No murmur heard. Pulmonary/Chest: Effort normal and breath sounds normal. No respiratory distress. She has no wheezes.  Abdominal: Soft. Bowel sounds are normal. She exhibits no distension and no mass. There is tenderness. There is no rebound and no guarding.  Genitourinary: Rectal exam shows guaiac positive stool.  Neurological: She is alert and oriented to person, place, and time. She exhibits  normal muscle tone. Coordination normal.  Skin: Skin is warm and dry. Capillary refill takes less than 2 seconds. No rash noted. No erythema.  Psychiatric: She has a normal mood and affect. Her behavior is normal.  Nursing note and vitals reviewed.    ED Treatments / Results  Labs (all labs ordered are listed, but only abnormal results are displayed) Labs Reviewed  COMPREHENSIVE METABOLIC PANEL - Abnormal; Notable for the following:       Result Value   AST 76 (*)    ALT 99 (*)    All other components within normal limits  CBC - Abnormal; Notable for the following:    RBC 5.12 (*)    All other components within normal limits  URINALYSIS, ROUTINE W REFLEX MICROSCOPIC - Abnormal; Notable for the following:    Bilirubin Urine SMALL (*)    All other components within normal limits  OCCULT BLOOD X 1 CARD TO LAB, STOOL - Abnormal; Notable for the following:    Fecal Occult Bld POSITIVE (*)    All other components within normal limits  PREGNANCY, URINE  POC OCCULT BLOOD, ED    EKG  EKG Interpretation None       Radiology No results found.  Procedures Procedures (including critical care time)  Medications Ordered in ED Medications  sodium chloride 0.9 % bolus 1,000 mL (0 mLs Intravenous Stopped 06/29/17 2345)  iopamidol (ISOVUE-300) 61 % injection 100 mL (100 mLs Intravenous Contrast Given 06/29/17 2244)     Initial Impression / Assessment and Plan / ED Course  I have reviewed the triage vital signs and the nursing notes.  Pertinent labs & imaging results that were available during my care of the patient were reviewed by me and considered in my medical decision making (see chart for details).     Patient is stable here in the emergency department.  Her CT scan does not show any significant abnormalities.  Patient will be referred to GI.  Due to the fact that her vital signs along with her hemoglobin stable at this time and advised to return for any worsening in her  condition  Final Clinical Impressions(s) / ED Diagnoses   Final diagnoses:  Rectal bleeding  Generalized abdominal pain    New Prescriptions Discharge Medication List as of 06/29/2017 11:37 PM    START taking these medications   Details  pantoprazole (PROTONIX) 20 MG tablet Take 1 tablet (20 mg total) by mouth daily., Starting Mon 06/29/2017, Print         Sharonda Llamas, Watson, PA-C 07/03/17 0101    Fredia Sorrow, MD 07/11/17 [phone]

## 2018-03-15 ENCOUNTER — Other Ambulatory Visit: Payer: Self-pay

## 2018-03-15 ENCOUNTER — Encounter (HOSPITAL_BASED_OUTPATIENT_CLINIC_OR_DEPARTMENT_OTHER): Payer: Self-pay | Admitting: *Deleted

## 2018-03-15 ENCOUNTER — Observation Stay (HOSPITAL_BASED_OUTPATIENT_CLINIC_OR_DEPARTMENT_OTHER)
Admission: EM | Admit: 2018-03-15 | Discharge: 2018-03-16 | Disposition: A | Discharge status: Home / Self Care | Payer: Self-pay | Attending: Internal Medicine | Admitting: Internal Medicine

## 2018-03-15 ENCOUNTER — Emergency Department (HOSPITAL_BASED_OUTPATIENT_CLINIC_OR_DEPARTMENT_OTHER): Payer: Self-pay

## 2018-03-15 DIAGNOSIS — Z6841 Body Mass Index (BMI) 40.0 and over, adult: Secondary | ICD-10-CM | POA: Insufficient documentation

## 2018-03-15 DIAGNOSIS — Z90710 Acquired absence of both cervix and uterus: Secondary | ICD-10-CM | POA: Insufficient documentation

## 2018-03-15 DIAGNOSIS — E669 Obesity, unspecified: Secondary | ICD-10-CM | POA: Insufficient documentation

## 2018-03-15 DIAGNOSIS — Z8619 Personal history of other infectious and parasitic diseases: Secondary | ICD-10-CM | POA: Insufficient documentation

## 2018-03-15 DIAGNOSIS — Z9889 Other specified postprocedural states: Secondary | ICD-10-CM | POA: Insufficient documentation

## 2018-03-15 DIAGNOSIS — Z8249 Family history of ischemic heart disease and other diseases of the circulatory system: Secondary | ICD-10-CM | POA: Insufficient documentation

## 2018-03-15 DIAGNOSIS — R0789 Other chest pain: Principal | ICD-10-CM | POA: Insufficient documentation

## 2018-03-15 DIAGNOSIS — R0602 Shortness of breath: Secondary | ICD-10-CM

## 2018-03-15 DIAGNOSIS — Z9049 Acquired absence of other specified parts of digestive tract: Secondary | ICD-10-CM | POA: Insufficient documentation

## 2018-03-15 DIAGNOSIS — R42 Dizziness and giddiness: Secondary | ICD-10-CM | POA: Insufficient documentation

## 2018-03-15 DIAGNOSIS — Z87828 Personal history of other (healed) physical injury and trauma: Secondary | ICD-10-CM | POA: Insufficient documentation

## 2018-03-15 DIAGNOSIS — R079 Chest pain, unspecified: Secondary | ICD-10-CM | POA: Diagnosis present

## 2018-03-15 DIAGNOSIS — K219 Gastro-esophageal reflux disease without esophagitis: Secondary | ICD-10-CM | POA: Diagnosis present

## 2018-03-15 LAB — CBC WITH DIFFERENTIAL/PLATELET
Basophils Absolute: 0.1 10*3/uL (ref 0.0–0.1)
Basophils Relative: 1 %
EOS PCT: 3 %
Eosinophils Absolute: 0.3 10*3/uL (ref 0.0–0.7)
HCT: 41.1 % (ref 36.0–46.0)
Hemoglobin: 13.3 g/dL (ref 12.0–15.0)
LYMPHS ABS: 2.9 10*3/uL (ref 0.7–4.0)
LYMPHS PCT: 32 %
MCH: 28 pg (ref 26.0–34.0)
MCHC: 32.4 g/dL (ref 30.0–36.0)
MCV: 86.5 fL (ref 78.0–100.0)
MONO ABS: 0.5 10*3/uL (ref 0.1–1.0)
MONOS PCT: 5 %
Neutro Abs: 5.5 10*3/uL (ref 1.7–7.7)
Neutrophils Relative %: 59 %
PLATELETS: 225 10*3/uL (ref 150–400)
RBC: 4.75 MIL/uL (ref 3.87–5.11)
RDW: 13.8 % (ref 11.5–15.5)
WBC: 9.2 10*3/uL (ref 4.0–10.5)

## 2018-03-15 LAB — BASIC METABOLIC PANEL
Anion gap: 9 (ref 5–15)
BUN: 13 mg/dL (ref 6–20)
CHLORIDE: 104 mmol/L (ref 101–111)
CO2: 27 mmol/L (ref 22–32)
Calcium: 9.5 mg/dL (ref 8.9–10.3)
Creatinine, Ser: 0.73 mg/dL (ref 0.44–1.00)
GFR calc Af Amer: >60 mL/min (ref >60)
GFR calc non Af Amer: >60 mL/min (ref >60)
GLUCOSE: 99 mg/dL (ref 65–99)
Potassium: 4 mmol/L (ref 3.5–5.1)
Sodium: 140 mmol/L (ref 135–145)

## 2018-03-15 LAB — TROPONIN I: Troponin I: <0.03 ng/mL (ref <0.03)

## 2018-03-15 LAB — D-DIMER, QUANTITATIVE: D-Dimer, Quant: <0.27 ug/mL-FEU (ref 0.00–0.50)

## 2018-03-15 LAB — PREGNANCY, URINE: Preg Test, Ur: NEGATIVE

## 2018-03-15 MED ORDER — ONDANSETRON HCL 4 MG/2ML IJ SOLN
4.0000 mg | Freq: Once | INTRAMUSCULAR | Status: AC
  Administered 2018-03-15: 4 mg via INTRAVENOUS
  Filled 2018-03-15: qty 2

## 2018-03-15 MED ORDER — ASPIRIN 81 MG PO CHEW
324.0000 mg | CHEWABLE_TABLET | Freq: Once | ORAL | Status: AC
  Administered 2018-03-15: 324 mg via ORAL
  Filled 2018-03-15: qty 4

## 2018-03-15 NOTE — ED Notes (Signed)
[facility] arrived to transport [facility] to [facility] 

## 2018-03-15 NOTE — ED Notes (Signed)
Patient transported to X-ray 

## 2018-03-15 NOTE — ED Notes (Signed)
Pt reports swimming in the pool this morning and becoming SOB and her chest was tight, similar episode happened this week while folding laundry.

## 2018-03-15 NOTE — ED Triage Notes (Signed)
Pt c/o CP x 1 week also c/o right arm pain and SOB.

## 2018-03-15 NOTE — Progress Notes (Signed)
36 yo female with c/o chest pain for 1.5 weeks. Pain substernal with radiation to left arm.  EKG nsr at 87, nl axis, no st-t changes c/w ischemia,  Trop negative ED requests admission for cp

## 2018-03-15 NOTE — ED Provider Notes (Signed)
[facility] EMERGENCY DEPARTMENT Provider Note   CSN: [id number] Arrival date & time: 03/15/18  1525     History   Chief Complaint Chief Complaint  Patient presents with  . Chest Pain    HPI Kathryn [name] is a 37 y.o. female with a reported past medical history of hypertension who presents the emergency department today for chest pain shortness of breath.  Patient notes over the last 1.5 weeks she has been having intermittent episodes of substernal chest pain that she describes as "someone squeezing the heart as they can" that radiates into her left arm and makes it feel heavy and is associated with shortness of breath as well as nausea.  She reports that the symptoms are brought on with exertion.  She reports that last week she had an episode when she was taking closed from the dryer and walking to her bedroom.  She also had an episode this morning when she was swimming in the pool.  She reports that the symptoms always last for greater than 5 minutes but never longer than 30 minutes.  They relieved with rest.  She has never tried any interventions other than rest for her symptoms.  She denies history of similar in the past.  She used to be able to exert herself without pain or shortness of breath prior to the onset of her symptoms.  She reports sometimes with her symptoms she does have radiation of the pain into her left neck and jaw but not always.  Pain does not radiate to the patient's back or abdomen.  The patient denies current chest pain.  Her last episode was this morning while she was swimming.  Patient denies preceding fever, URI symptoms.  Her pain is not positional or pleuritic in nature.  She reports that she does have a family history of heart disease including patient's mother.  She denies history of tobacco abuse.  She denies history of MI, CVA, TIA, prior cardiac evaluation with echocardiogram, stress test or heart catheterization.  Denies associated fever,  headache, numbness/tingling/weakness of the extremities, abdominal pain, emesis, hemoptysis, cough, exogenous estrogen use, recent surgery or travel, trauma, immobilization, previous blood clot, cough, hemoptysis, cancer, lower extremity pain or swelling.   HPI  Past Medical History:  Diagnosis Date  . Clostridium difficile infection 06/2014   not hospitalized  . Pneumonia   . PONV (postoperative nausea and vomiting)    cries when awakening    Patient Active Problem List   Diagnosis Date Noted  . HYPERTHYROIDISM 07/05/2007  . VITAMIN B12 DEFICIENCY 07/05/2007  . ALLERGIC RHINITIS 07/05/2007  . MENOPAUSAL SYNDROME 07/05/2007    Past Surgical History:  Procedure Laterality Date  . ABDOMINAL HYSTERECTOMY  10/2011  . arthroscopic knee  2012  . CESAREAN SECTION  2006, 2009   x 2  . CHOLECYSTECTOMY    . MANDIBLE FRACTURE SURGERY  1982   age 74  . ROBOTIC ASSISTED DIAGNOSTIC LAPAROSCOPY Left 08/06/2015   Procedure: ROBOTIC ASSISTED DIAGNOSTIC LAPAROSCOPY with lysis of adhesions; left oophorectomy;  Surgeon: Brien Few, MD;  Location: [facility];  Service: Gynecology;  Laterality: Left;  . TMJ ARTHROPLASTY  1996  . WRIST FRACTURE SURGERY  1998  . WRIST GANGLION EXCISION  2011     OB History   None      Home Medications    Prior to Admission medications   Medication Sig Start Date End Date Taking? Authorizing Provider  HYDROcodone-ibuprofen (VICOPROFEN) 7.5-200 MG tablet Take 1 tablet by  mouth every 8 (eight) hours as needed for moderate pain. 08/06/15   Brien Few, MD  pantoprazole (PROTONIX) 20 MG tablet Take 1 tablet (20 mg total) by mouth daily. 06/29/17   Dalia Heading, PA-C    Family History Family History  Problem Relation Age of Onset  . Hypertension Father     Social History Social History   Tobacco Use  . Smoking status: Never Smoker  . Smokeless tobacco: Never Used  Substance Use Topics  . Alcohol use: No  . Drug use: No     Allergies     Patient has no known allergies.   Review of Systems Review of Systems  All other systems reviewed and are negative.    Physical Exam Updated Vital Signs BP (!) 158/100   Pulse 89   Temp 98.2 F (36.8 C) (Oral)   Resp 16   Ht 5\' 4"  (1.626 m)   Wt 115.7 kg (255 lb)   LMP 10/24/2011   SpO2 100%   BMI 43.77 kg/m   Physical Exam  Constitutional: She appears well-developed and well-nourished.  HENT:  Head: Normocephalic and atraumatic.  Right Ear: External ear normal.  Left Ear: External ear normal.  Nose: Nose normal.  Mouth/Throat: Uvula is midline, oropharynx is clear and moist and mucous membranes are normal. No tonsillar exudate.  Eyes: Pupils are equal, round, and reactive to light. Right eye exhibits no discharge. Left eye exhibits no discharge. No scleral icterus.  Neck: Trachea normal. Neck supple. No spinous process tenderness present. No neck rigidity. Normal range of motion present.  Cardiovascular: Normal rate, regular rhythm and intact distal pulses.  No murmur heard. Pulses:      Radial pulses are 2+ on the right side, and 2+ on the left side.       Dorsalis pedis pulses are 2+ on the right side, and 2+ on the left side.       Posterior tibial pulses are 2+ on the right side, and 2+ on the left side.  No lower extremity swelling or edema. Calves symmetric in size bilaterally.  Pulmonary/Chest: Effort normal and breath sounds normal. She exhibits no tenderness.  Abdominal: Soft. Bowel sounds are normal. There is no tenderness. There is no rebound and no guarding.  Musculoskeletal: She exhibits no edema.  Lymphadenopathy:    She has no cervical adenopathy.  Neurological: She is alert.  Speech clear. Follows commands. No facial droop. PERRLA. EOM grossly intact. CN III-XII grossly intact. Grossly moves all extremities 4 without ataxia. Able and appropriate strength for age to upper and lower extremities bilaterally. Normal sensation to upper and lower. Normal  gait.   Skin: Skin is warm and dry. No rash noted. She is not diaphoretic.  Psychiatric: She has a normal mood and affect.  Nursing note and vitals reviewed.    ED Treatments / Results  Labs (all labs ordered are listed, but only abnormal results are displayed) Labs Reviewed  BASIC METABOLIC PANEL  CBC WITH DIFFERENTIAL/PLATELET  PREGNANCY, URINE  TROPONIN I  D-DIMER, QUANTITATIVE (NOT AT [facility])    EKG EKG Interpretation  Date/Time:  Monday March 15 2018 15:34:49 EDT Ventricular Rate:  87 PR Interval:  138 QRS Duration: 84 QT Interval:  372 QTC Calculation: 447 R Axis:   35 Text Interpretation:  Normal sinus rhythm Low voltage QRS Borderline ECG When compared to prior, new t wave incersionin lead 3.  No STEMI Confirmed by Antony Blackbird [phone]) on 03/15/2018 4:23:49 PM   Radiology Dg  Chest 2 View  Result Date: 03/15/2018 CLINICAL DATA:  Chest pain and shortness of breath for 2 weeks. EXAM: CHEST - 2 VIEW COMPARISON:  01/08/2015 FINDINGS: Heart size is normal. Both lungs are clear. Mild thoracic scoliosis again noted. IMPRESSION: No active cardiopulmonary disease. Electronically Signed   By: Earle Gell M.D.   On: 03/15/2018 17:06    Procedures Procedures (including critical care time)  Medications Ordered in ED Medications  aspirin chewable tablet 324 mg (324 mg Oral Given 03/15/18 1933)     Initial Impression / Assessment and Plan / ED Course  I have reviewed the triage vital signs and the nursing notes.  Pertinent labs & imaging results that were available during my care of the patient were reviewed by me and considered in my medical decision making (see chart for details).     37 year old female with a reported history of hypertension who presents emergency department today for intermittent exertional chest pain is associate with shortness of breath, nausea and radiates to the patient's left shoulder +/- left jaw.  His pain is typically brought on with exertion and  subsides with rest.  It lasts for patient's vital signs are reassuring on presentation.  She is currently without any chest pain.  No interventions prior to arrival.  EKG with nonspecific ST changes.  No evidence of STEMI.  Patient does have risk factors include hypertension, obesity, and family history of heart disease.  Her lab work is completely reassuring as above.  Chest x-ray is without active cardiopulmonary disease.  She remains chest pain-free in the department.  Patients heart score is a 4.  Will admit for chest pain rule out.  Appreciate Dr. Maudie Mercury for admitting the patient to the hospitalist service.  Final Clinical Impressions(s) / ED Diagnoses   Final diagnoses:  Other chest pain  Shortness of breath    ED Discharge Orders    None       Lorelle Gibbs 03/16/18 0044    Tegeler, Gwenyth Allegra, MD 03/16/18 [phone]

## 2018-03-16 ENCOUNTER — Observation Stay (HOSPITAL_BASED_OUTPATIENT_CLINIC_OR_DEPARTMENT_OTHER): Payer: Self-pay

## 2018-03-16 DIAGNOSIS — R0789 Other chest pain: Secondary | ICD-10-CM

## 2018-03-16 DIAGNOSIS — R0602 Shortness of breath: Secondary | ICD-10-CM

## 2018-03-16 DIAGNOSIS — R079 Chest pain, unspecified: Secondary | ICD-10-CM

## 2018-03-16 DIAGNOSIS — K219 Gastro-esophageal reflux disease without esophagitis: Secondary | ICD-10-CM | POA: Diagnosis present

## 2018-03-16 LAB — LIPID PANEL
CHOLESTEROL: 230 mg/dL — AB (ref 0–200)
HDL: 44 mg/dL (ref >40)
LDL Cholesterol: 129 mg/dL — ABNORMAL HIGH (ref 0–99)
TRIGLYCERIDES: 286 mg/dL — AB (ref <150)
Total CHOL/HDL Ratio: 5.2 RATIO
VLDL: 57 mg/dL — ABNORMAL HIGH (ref 0–40)

## 2018-03-16 LAB — ECHOCARDIOGRAM COMPLETE
HEIGHTINCHES: 64 in
Weight: 4110.4 oz

## 2018-03-16 LAB — HEMOGLOBIN A1C
HEMOGLOBIN A1C: 5.5 % (ref 4.8–5.6)
MEAN PLASMA GLUCOSE: 111.15 mg/dL

## 2018-03-16 LAB — HIV ANTIBODY (ROUTINE TESTING W REFLEX): HIV Screen 4th Generation wRfx: NONREACTIVE

## 2018-03-16 LAB — TROPONIN I
Troponin I: <0.03 ng/mL (ref <0.03)
Troponin I: <0.03 ng/mL (ref <0.03)

## 2018-03-16 MED ORDER — NITROGLYCERIN 0.4 MG SL SUBL: 0.4000 mg | SUBLINGUAL_TABLET | SUBLINGUAL | Status: DC | PRN

## 2018-03-16 MED ORDER — ACETAMINOPHEN 325 MG PO TABS: 650.0000 mg | ORAL_TABLET | Freq: Four times a day (QID) | ORAL | Status: DC | PRN

## 2018-03-16 MED ORDER — ONDANSETRON HCL 4 MG/2ML IJ SOLN: 4.0000 mg | Freq: Three times a day (TID) | INTRAMUSCULAR | Status: DC | PRN

## 2018-03-16 MED ORDER — HYDROCODONE-ACETAMINOPHEN 7.5-325 MG PO TABS: 1.0000 | ORAL_TABLET | Freq: Three times a day (TID) | ORAL | Status: DC | PRN

## 2018-03-16 MED ORDER — PANTOPRAZOLE SODIUM 20 MG PO TBEC
20.0000 mg | DELAYED_RELEASE_TABLET | Freq: Every day | ORAL | Status: DC
  Administered 2018-03-16: 20 mg via ORAL
  Filled 2018-03-16: qty 1

## 2018-03-16 MED ORDER — ENOXAPARIN SODIUM 40 MG/0.4ML ~~LOC~~ SOLN
40.0000 mg | SUBCUTANEOUS | Status: DC
  Filled 2018-03-16: qty 0.4

## 2018-03-16 MED ORDER — SODIUM CHLORIDE 0.9 % IV SOLN
INTRAVENOUS | Status: DC
  Administered 2018-03-16: 05:00:00 via INTRAVENOUS

## 2018-03-16 MED ORDER — HYDRALAZINE HCL 20 MG/ML IJ SOLN: 5.0000 mg | INTRAMUSCULAR | Status: DC | PRN

## 2018-03-16 MED ORDER — ZOLPIDEM TARTRATE 5 MG PO TABS: 5.0000 mg | ORAL_TABLET | Freq: Every evening | ORAL | Status: DC | PRN

## 2018-03-16 MED ORDER — ASPIRIN 81 MG PO CHEW
324.0000 mg | CHEWABLE_TABLET | Freq: Every day | ORAL | Status: DC
  Filled 2018-03-16: qty 4

## 2018-03-16 MED ORDER — MORPHINE SULFATE (PF) 2 MG/ML IV SOLN: 2.0000 mg | INTRAVENOUS | Status: DC | PRN

## 2018-03-16 NOTE — Discharge Summary (Signed)
Physician Discharge Summary   Patient ID: Kathryn [name] MRN: [id number] DOB/AGE: [birth year] 37 y.o.  Admit date: 03/15/2018 Discharge date: 03/16/2018  Primary Care Physician:  Haywood Pao, MD   Recommendations for Outpatient Follow-up:  1. Follow up with PCP in 1-2 weeks  Home Health: None  Equipment/Devices:   Discharge Condition: stable  CODE STATUS: FULL  Diet recommendation: Healthy diet   Discharge Diagnoses:    . Atypical chest pain . GERD (gastroesophageal reflux disease)   Consults: Cardiology    Allergies:  No Known Allergies   DISCHARGE MEDICATIONS: Allergies as of 03/16/2018   No Known Allergies     Medication List    TAKE these medications   HYDROcodone-ibuprofen 7.5-200 MG tablet Commonly known as:  VICOPROFEN Take 1 tablet by mouth every 8 (eight) hours as needed for moderate pain.   pantoprazole 20 MG tablet Commonly known as:  PROTONIX Take 1 tablet (20 mg total) by mouth daily.        Brief H and P: For complete details please refer to admission H and P, but in brief Briefly 37 year old female with history of GERD, hypertension obesity, presented with intermittent chest pain, substernal, worse with exertion with shortness of breath, dizziness for the last 10 days. No family history of premature CAD.  No prior episodes or prior cardiac work-up.  Hospital Course:   Atypical chest pain  -Possibly musculoskeletal, EKG normal, chest x-ray negative for pneumonia.  D-dimer was negative cardiac enzymes negative. -Prior cardiac work-up hence cardiology was consulted, patient underwent 2D echo which was normal, EF 55 to 60%, normal wall motion.    GERD (gastroesophageal reflux disease) -Continue PPI   Day of Discharge S: Feels a lot better, hoping to go home today if work-up is negative  BP 130/80   Pulse 80   Temp 98 F (36.7 C) (Oral)   Resp 15   Ht 5\' 4"  (1.626 m)   Wt 116.5 kg (256 lb 14.4 oz) Comment: scale c  LMP  10/24/2011   SpO2 98%   BMI 44.10 kg/m   Physical Exam: General: Alert and awake oriented x3 not in any acute distress. HEENT: anicteric sclera, pupils reactive to light and accommodation CVS: S1-S2 clear no murmur rubs or gallops Chest: clear to auscultation bilaterally, no wheezing rales or rhonchi Abdomen: soft nontender, nondistended, normal bowel sounds Extremities: no cyanosis, clubbing or edema noted bilaterally Neuro: Cranial nerves II-XII intact, no focal neurological deficits   The results of significant diagnostics from this hospitalization (including imaging, microbiology, ancillary and laboratory) are listed below for reference.      Procedures/Studies:  2D echo - Left ventricle: The cavity size was normal. Systolic function was   normal. The estimated ejection fraction was in the range of 55%   to 60%. Wall motion was normal; there were no regional wall   motion abnormalities. Left ventricular diastolic function   parameters were normal. - Aortic valve: There was no regurgitation. - Aortic root: The aortic root was normal in size. - Left atrium: The atrium was at the upper limits of normal in   size. - Right ventricle: The cavity size was normal. Wall thickness was   normal. Systolic function was normal. - Right atrium: The atrium was normal in size. - Tricuspid valve: There was trivial regurgitation. - Pulmonary arteries: Systolic pressure was within the normal   range. - Inferior vena cava: The vessel was normal in size. - Pericardium, extracardiac: There was no pericardial effusion  Dg Chest 2 View  Result Date: 03/15/2018 CLINICAL DATA:  Chest pain and shortness of breath for 2 weeks. EXAM: CHEST - 2 VIEW COMPARISON:  01/08/2015 FINDINGS: Heart size is normal. Both lungs are clear. Mild thoracic scoliosis again noted. IMPRESSION: No active cardiopulmonary disease. Electronically Signed   By: Earle Gell M.D.   On: 03/15/2018 17:06       LAB  RESULTS: Basic Metabolic Panel: Recent Labs  Lab 03/15/18 1710  NA 140  K 4.0  CL 104  CO2 27  GLUCOSE 99  BUN 13  CREATININE 0.73  CALCIUM 9.5   Liver Function Tests: No results for input(s): AST, ALT, ALKPHOS, BILITOT, PROT, ALBUMIN in the last 168 hours. No results for input(s): LIPASE, AMYLASE in the last 168 hours. No results for input(s): AMMONIA in the last 168 hours. CBC: Recent Labs  Lab 03/15/18 1710  WBC 9.2  NEUTROABS 5.5  HGB 13.3  HCT 41.1  MCV 86.5  PLT 225   Cardiac Enzymes: Recent Labs  Lab 03/16/18 0354 03/16/18 0855  TROPONINI <0.03 <0.03   BNP: Invalid input(s): POCBNP CBG: No results for input(s): GLUCAP in the last 168 hours.    Disposition and Follow-up: Discharge Instructions    Diet - low sodium heart healthy   Complete by:  As directed    Increase activity slowly   Complete by:  As directed        DISPOSITION: home    DISCHARGE FOLLOW-UP Follow-up Information    Tisovec, Fransico Him, MD. Schedule an appointment as soon as possible for a visit in 2 week(s).   Specialty:  Internal Medicine Contact information: [address] [place] [address] [phone]            Time coordinating discharge:  56mins   Signed:   Estill Cotta M.D. [facility] 03/16/2018, 11:37 AM Pager: [phone]

## 2018-03-16 NOTE — Plan of Care (Signed)
  Problem: Activity: Goal: Risk for activity intolerance will decrease Outcome: Progressing   Problem: Safety: Goal: Ability to remain free from injury will improve Outcome: Progressing   

## 2018-03-16 NOTE — Consult Note (Signed)
Cardiology Consultation:   Patient ID: Kathryn [name]; [id number]; 08/10/1981   Admit date: 03/15/2018 Date of Consult: 03/16/2018  Primary Care Provider: Haywood Pao, MD Primary Cardiologist: N/A Primary Electrophysiologist:  N/A   Patient Profile:   Kathryn [name] is a 37 y.o. female with a hx of GERD who is being seen today for the evaluation of chest pain at the request of Dr. Blaine Hamper.  History of Present Illness:   Kathryn [name] is a healthy 37 yo F who presented to the ER yesterday with complaint of intermittent chest pain for one and a half weeks. Chest pain is exacerbated by activity such as swimming in the pool with her daughter and carrying loads of laundry, improves with rest. She describes the pain as a substernal pressure without radiation. It is accompanied by shortness of breath and racing heart. She denies tobacco, alcohol, or illicit drugs. Family history without significant CAD. Reports MI in her grandfather and grandmother > age 477.   On arrival to the ED, patient was afebrile and initially hypertensive with BP 158/100, later improved to 131/77 without intervention. EKG was NSR without ischemic changes. D-dimer was negative. Urine pregnancy test negative. Troponins were trended overnight and remained < 0.03 x 3. Lipid panel was collected with elevated cholesterol 230, triglycerides 286, and LDL 129. A1c 5.5. UDS has not been collected.   Past Medical History:  Diagnosis Date  . Clostridium difficile infection 06/2014   not hospitalized  . Pneumonia   . PONV (postoperative nausea and vomiting)    cries when awakening    Past Surgical History:  Procedure Laterality Date  . ABDOMINAL HYSTERECTOMY  10/2011  . arthroscopic knee  2012  . CESAREAN SECTION  2006, 2009   x 2  . CHOLECYSTECTOMY    . MANDIBLE FRACTURE SURGERY  1982   age 45  . ROBOTIC ASSISTED DIAGNOSTIC LAPAROSCOPY Left 08/06/2015   Procedure: ROBOTIC ASSISTED DIAGNOSTIC LAPAROSCOPY  with lysis of adhesions; left oophorectomy;  Surgeon: Brien Few, MD;  Location: [facility];  Service: Gynecology;  Laterality: Left;  . TMJ ARTHROPLASTY  1996  . WRIST FRACTURE SURGERY  1998  . WRIST GANGLION EXCISION  2011     Home Medications:  Prior to Admission medications   Medication Sig Start Date End Date Taking? Authorizing Provider  HYDROcodone-ibuprofen (VICOPROFEN) 7.5-200 MG tablet Take 1 tablet by mouth every 8 (eight) hours as needed for moderate pain. 08/06/15   Brien Few, MD  pantoprazole (PROTONIX) 20 MG tablet Take 1 tablet (20 mg total) by mouth daily. 06/29/17   Dalia Heading, PA-C    Inpatient Medications: Scheduled Meds: . aspirin  324 mg Oral Daily  . enoxaparin (LOVENOX) injection  40 mg Subcutaneous Q24H  . pantoprazole  20 mg Oral Daily   Continuous Infusions: . sodium chloride 75 mL/hr at 03/16/18 0500   PRN Meds: acetaminophen, hydrALAZINE, HYDROcodone-acetaminophen, morphine injection, nitroGLYCERIN, ondansetron (ZOFRAN) IV, zolpidem  Allergies:   No Known Allergies  Social History:   Social History   Socioeconomic History  . Marital status: Single    Spouse name: Not on file  . Number of children: Not on file  . Years of education: Not on file  . Highest education level: Not on file  Occupational History  . Not on file  Social Needs  . Financial resource strain: Not on file  . Food insecurity:    Worry: Not on file    Inability: Not on file  . Transportation needs:  Medical: Not on file    Non-medical: Not on file  Tobacco Use  . Smoking status: Never Smoker  . Smokeless tobacco: Never Used  Substance and Sexual Activity  . Alcohol use: No  . Drug use: No  . Sexual activity: Yes    Birth control/protection: Surgical  Lifestyle  . Physical activity:    Days per week: Not on file    Minutes per session: Not on file  . Stress: Not on file  Relationships  . Social connections:    Talks on phone: Not on file    Gets  together: Not on file    Attends religious service: Not on file    Active member of club or organization: Not on file    Attends meetings of clubs or organizations: Not on file    Relationship status: Not on file  . Intimate partner violence:    Fear of current or ex partner: Not on file    Emotionally abused: Not on file    Physically abused: Not on file    Forced sexual activity: Not on file  Other Topics Concern  . Not on file  Social History Narrative  . Not on file    Family History:    Family History  Problem Relation Age of Onset  . Hypertension Father      ROS:  Please see the history of present illness.  All other ROS reviewed and negative.     Physical Exam/Data:   Vitals:   03/15/18 1804 03/15/18 2058 03/15/18 2228 03/16/18 0551  BP: 139/84 131/77 (!) 129/92 134/89  Pulse: 80 84 81 83  Resp: 16 (!) 24 18 19   Temp:   98 F (36.7 C) 97.9 F (36.6 C)  TempSrc:   Oral Oral  SpO2: 100% 98% 96% 97%  Weight:    256 lb 14.4 oz (116.5 kg)  Height:        Intake/Output Summary (Last 24 hours) at 03/16/2018 0816 Last data filed at 03/16/2018 0554 Gross per 24 hour  Intake 240 ml  Output 300 ml  Net -60 ml   Filed Weights   03/15/18 1538 03/16/18 0551  Weight: 255 lb (115.7 kg) 256 lb 14.4 oz (116.5 kg)   Body mass index is 44.1 kg/m.  General:  Well nourished, well developed, in no acute distress HEENT: normal Lymph: no adenopathy Neck: no JVD Endocrine:  No thryomegaly Vascular: No carotid bruits; FA pulses 2+ bilaterally without bruits  Cardiac:  normal S1, S2; RRR; no murmur  Lungs:  clear to auscultation bilaterally, no wheezing, rhonchi or rales  Abd: soft, nontender, no hepatomegaly  Ext: no edema Musculoskeletal:  No deformities, BUE and BLE strength normal and equal Skin: warm and dry  Neuro:  CNs 2-12 intact, no focal abnormalities noted Psych:  Normal affect   EKG:  The EKG was personally reviewed and demonstrates:  NSR, no ischemic  changes x 2 Telemetry:  Telemetry was personally reviewed and demonstrates:  NSR  Relevant CV Studies: None  Laboratory Data:  Chemistry Recent Labs  Lab 03/15/18 1710  NA 140  K 4.0  CL 104  CO2 27  GLUCOSE 99  BUN 13  CREATININE 0.73  CALCIUM 9.5  GFRNONAA >60  GFRAA >60  ANIONGAP 9    No results for input(s): PROT, ALBUMIN, AST, ALT, ALKPHOS, BILITOT in the last 168 hours. Hematology Recent Labs  Lab 03/15/18 1710  WBC 9.2  RBC 4.75  HGB 13.3  HCT 41.1  MCV 86.5  MCH 28.0  MCHC 32.4  RDW 13.8  PLT 225   Cardiac Enzymes Recent Labs  Lab 03/15/18 1710 03/16/18 0354  TROPONINI <0.03 <0.03   No results for input(s): TROPIPOC in the last 168 hours.  BNPNo results for input(s): BNP, PROBNP in the last 168 hours.  DDimer  Recent Labs  Lab 03/15/18 1710  DDIMER <0.27    Radiology/Studies:  Dg Chest 2 View  Result Date: 03/15/2018 CLINICAL DATA:  Chest pain and shortness of breath for 2 weeks. EXAM: CHEST - 2 VIEW COMPARISON:  01/08/2015 FINDINGS: Heart size is normal. Both lungs are clear. Mild thoracic scoliosis again noted. IMPRESSION: No active cardiopulmonary disease. Electronically Signed   By: Earle Gell M.D.   On: 03/15/2018 17:06    Assessment and Plan:   Chest Pain: ACS ruled out with serial troponins and EKGs. No significant CAD risk factors aside from obesity and HLD. Low suspicion for cardiac etiology of her chest pain. No further work up at this time.  -- F/u echocardiogram, if normal ok for discharge -- Follow up PCP for elevated BP and HLD -- 10 year ASCVD risk of 1.2%, no indication for statin  -- Cardiology will sign off, please call back with questions   For questions or updates, please contact [place] Please consult [email or web address] for contact info under Cardiology/STEMI.   Signed, Velna Ochs, MD  03/16/2018 8:16 AM

## 2018-03-16 NOTE — Progress Notes (Signed)
Patient seen and examined, admitted by Dr. Blaine Hamper this AM.  Briefly 37 year old female with history of GERD, hypertension obesity, presented with intermittent chest pain, substernal, worse with exertion with shortness of breath, dizziness for the last 10 days. No family history of premature CAD.  No prior episodes or prior cardiac work-up. BP 134/89 (BP Location: Left Arm)   Pulse 83   Temp 97.9 F (36.6 C) (Oral)   Resp 19   Ht 5\' 4"  (1.626 m)   Wt 116.5 kg (256 lb 14.4 oz) Comment: scale c  LMP 10/24/2011   SpO2 97%   BMI 44.10 kg/m    On exam  CVS:S1-S2 clear, RRR Chest: CTA B, no chest wall tenderness Abdomen: NT, ND, NBS  Atypical chest pain -Cardiac enzymes so far negative, EKG normal, chest x-ray negative for pneumonia, d-dimer negative -Chest pain currently 3/10, LDL 129, hemoglobin A1c 5.5  - pending 2D echo, cardiology consulted   Ripudeep Rai M.D. [facility] 03/16/2018, 8:49 AM  Pager: [phone]

## 2018-03-16 NOTE — Progress Notes (Signed)
Patient refused ASA dose.

## 2018-03-16 NOTE — H&P (Signed)
History and Physical    Kathryn [name] OEU:[id number] DOB: 01-Aug-1981 DOA: 03/15/2018  Referring MD/NP/PA:   PCP: Haywood Pao, MD   Patient coming from:  The patient is coming from home.  At baseline, pt is independent for most of ADL.   Chief Complaint: Chest pain  HPI: Kathryn [name] is a 37 y.o. female with medical history significant of GERD, gestational hypertension, obesity, who presents with chest pain.  Patient states that she has been having chest pain for almost 10 days.  It is located in the substernal area, dull, pressure-like, radiating to the left arm.  It is moderate, intermittent, happens approximately 5 times each day, lasted for 5 minutes to 30 minutes each time, then resolves spontaneously.  It is associated with shortness of breath and dizziness.  Not aggravated or alleviated by any known factors.  Patient does not have cough, fever or chills.  No recent long distance traveling.  No tenderness in the calf areas.  Patient has nausea, but no vomiting, diarrhea or abdominal pain denies symptoms of UTI.  No unilateral weakness, facial droop or slurred speech.  Patient had elevated blood pressure initially 158/100 in ED-->129/92 now.  ED Course: pt was found to have negative d-dimer 0.27, WBC 9.2, negative troponin, negative pregnancy test, electrolytes renal function okay, temperature normal, no tachycardia, oxygen saturation 96% on room air, negative chest x-ray for acute issues.  Patient is placed on telemetry bed of observation.  Review of Systems:   General: no fevers, chills, no body weight gain, has fatigue HEENT: no blurry vision, hearing changes or sore throat Respiratory: has dyspnea, no coughing, wheezing CV: has chest pain, no palpitations GI: has nausea, no vomiting, abdominal pain, diarrhea, constipation GU: no dysuria, burning on urination, increased urinary frequency, hematuria  Ext: no leg edema Neuro: no unilateral weakness, numbness,  or tingling, no vision change or hearing loss Skin: no rash, no skin tear. MSK: No muscle spasm, no deformity, no limitation of range of movement in spin Heme: No easy bruising.  Travel history: No recent long distant travel.  Allergy: No Known Allergies  Past Medical History:  Diagnosis Date  . Clostridium difficile infection 06/2014   not hospitalized  . Pneumonia   . PONV (postoperative nausea and vomiting)    cries when awakening    Past Surgical History:  Procedure Laterality Date  . ABDOMINAL HYSTERECTOMY  10/2011  . arthroscopic knee  2012  . CESAREAN SECTION  2006, 2009   x 2  . CHOLECYSTECTOMY    . MANDIBLE FRACTURE SURGERY  1982   age 8  . ROBOTIC ASSISTED DIAGNOSTIC LAPAROSCOPY Left 08/06/2015   Procedure: ROBOTIC ASSISTED DIAGNOSTIC LAPAROSCOPY with lysis of adhesions; left oophorectomy;  Surgeon: Brien Few, MD;  Location: [facility];  Service: Gynecology;  Laterality: Left;  . TMJ ARTHROPLASTY  1996  . WRIST FRACTURE SURGERY  1998  . WRIST GANGLION EXCISION  2011    Social History:  reports that she has never smoked. She has never used smokeless tobacco. She reports that she does not drink alcohol or use drugs.  Family History:  Family History  Problem Relation Age of Onset  . Hypertension Father      Prior to Admission medications   Medication Sig Start Date End Date Taking? Authorizing Provider  HYDROcodone-ibuprofen (VICOPROFEN) 7.5-200 MG tablet Take 1 tablet by mouth every 8 (eight) hours as needed for moderate pain. 08/06/15   Brien Few, MD  pantoprazole (PROTONIX) 20 MG tablet Take  1 tablet (20 mg total) by mouth daily. 06/29/17   Dalia Heading, PA-C    Physical Exam: Vitals:   03/15/18 1538 03/15/18 1804 03/15/18 2058 03/15/18 2228  BP:  139/84 131/77 (!) 129/92  Pulse:  80 84 81  Resp:  16 (!) 24 18  Temp:    98 F (36.7 C)  TempSrc:    Oral  SpO2:  100% 98% 96%  Weight: 115.7 kg (255 lb)     Height: 5\' 4"  (1.626 m)       General: Not in acute distress HEENT:       Eyes: PERRL, EOMI, no scleral icterus.       ENT: No discharge from the ears and nose, no pharynx injection, no tonsillar enlargement.        Neck: No JVD, no bruit, no mass felt. Heme: No neck lymph node enlargement. Cardiac: S1/S2, RRR, No murmurs, No gallops or rubs. Respiratory: No rales, wheezing, rhonchi or rubs. GI: Soft, nondistended, nontender, no rebound pain, no organomegaly, BS present. GU: No hematuria Ext: No pitting leg edema bilaterally. 2+DP/PT pulse bilaterally. Musculoskeletal: No joint deformities, No joint redness or warmth, no limitation of ROM in spin. Skin: No rashes.  Neuro: Alert, oriented X3, cranial nerves II-XII grossly intact, moves all extremities normally.  Psych: Patient is not psychotic, no suicidal or hemocidal ideation.  Labs on Admission: I have personally reviewed following labs and imaging studies  CBC: Recent Labs  Lab 03/15/18 1710  WBC 9.2  NEUTROABS 5.5  HGB 13.3  HCT 41.1  MCV 86.5  PLT 423   Basic Metabolic Panel: Recent Labs  Lab 03/15/18 1710  NA 140  K 4.0  CL 104  CO2 27  GLUCOSE 99  BUN 13  CREATININE 0.73  CALCIUM 9.5   GFR: Estimated Creatinine Clearance: 117.9 mL/min (by C-G formula based on SCr of 0.73 mg/dL). Liver Function Tests: No results for input(s): AST, ALT, ALKPHOS, BILITOT, PROT, ALBUMIN in the last 168 hours. No results for input(s): LIPASE, AMYLASE in the last 168 hours. No results for input(s): AMMONIA in the last 168 hours. Coagulation Profile: No results for input(s): INR, PROTIME in the last 168 hours. Cardiac Enzymes: Recent Labs  Lab 03/15/18 1710  TROPONINI <0.03   BNP (last 3 results) No results for input(s): PROBNP in the last 8760 hours. HbA1C: No results for input(s): HGBA1C in the last 72 hours. CBG: No results for input(s): GLUCAP in the last 168 hours. Lipid Profile: No results for input(s): CHOL, HDL, LDLCALC, TRIG, CHOLHDL,  LDLDIRECT in the last 72 hours. Thyroid Function Tests: No results for input(s): TSH, T4TOTAL, FREET4, T3FREE, THYROIDAB in the last 72 hours. Anemia Panel: No results for input(s): VITAMINB12, FOLATE, FERRITIN, TIBC, IRON, RETICCTPCT in the last 72 hours. Urine analysis:    Component Value Date/Time   COLORURINE YELLOW 06/29/2017 2022   APPEARANCEUR CLEAR 06/29/2017 2022   LABSPEC 1.025 06/29/2017 2022   PHURINE 6.0 06/29/2017 2022   GLUCOSEU NEGATIVE 06/29/2017 2022   HGBUR NEGATIVE 06/29/2017 2022   BILIRUBINUR SMALL (A) 06/29/2017 2022   KETONESUR NEGATIVE 06/29/2017 2022   PROTEINUR NEGATIVE 06/29/2017 2022   UROBILINOGEN 0.2 03/02/2013 2230   NITRITE NEGATIVE 06/29/2017 2022   LEUKOCYTESUR NEGATIVE 06/29/2017 2022   Sepsis Labs: @LABRCNTIP (procalcitonin:4,lacticidven:4) )No results found for this or any previous visit (from the past 240 hour(s)).   Radiological Exams on Admission: Dg Chest 2 View  Result Date: 03/15/2018 CLINICAL DATA:  Chest pain and shortness of breath  for 2 weeks. EXAM: CHEST - 2 VIEW COMPARISON:  01/08/2015 FINDINGS: Heart size is normal. Both lungs are clear. Mild thoracic scoliosis again noted. IMPRESSION: No active cardiopulmonary disease. Electronically Signed   By: Kathryn [name] M.D.   On: 03/15/2018 17:06     EKG: Independently reviewed.  Sinus rhythm, QTC 447, low voltage, Q waves in lead III   Assessment/Plan Principal Problem:   Chest pain Active Problems:   GERD (gastroesophageal reflux disease)   Chest pain: Etiology is not clear.  D-dimer is negative, no recent long distance traveling, no tenderness in the calf area, no oxygen desaturation, less likely to have PE.  Chest x-ray negative for pneumonia.  Initial troponin negative. Will do CP rule out work up.  - will place on Tele bed for obs - cycle CE q6 x3 and repeat EKG in the am  - prn Nitroglycerin, Morphine, and aspirin - Risk factor stratification: will check FLP,UDS and A1C  -  2d echo - please call Card in AM   GERD: -Protonix  DVT ppx:  SQ Lovenox Code Status: Full code Family Communication:  Yes, patient's husband at bed side Disposition Plan:  Anticipate discharge back to previous home environment Consults called:  none Admission status: Obs / tele     Date of Service 03/16/2018    Ivor Costa [facility] Pager [phone]  If 7PM-7AM, please contact night-coverage [email or web address] Password [email or web address] 03/16/2018, 3:40 AM

## 2018-03-16 NOTE — Progress Notes (Signed)
  Echocardiogram 2D Echocardiogram has been performed.  Kathryn [name] 03/16/2018, 11:13 AM

## 2018-10-18 IMAGING — CT CT ABD-PELV W/ CM
2 of 4 series · 16 of 46 positions shown, 18 images · IV contrast (APPLIED)
Comparison: CT 06/20/2014

CLINICAL DATA: Abdominal pain and fever. Abscess, diverticulitis or
gastroenteritis suspected.

EXAM:
CT ABDOMEN AND PELVIS WITH CONTRAST
TECHNIQUE: Multidetector CT imaging of the abdomen and pelvis was performed
using the standard protocol following bolus administration of
intravenous contrast.
CONTRAST:  100mL [id number] IOPAMIDOL ([id number]) INJECTION 61%

[Series 2: axial st · axial · 0.96mm/px · z∈[+758,+1218]mm · 13 of 102 slices shown, 15 images]
[im 5/102  soft-tissue]
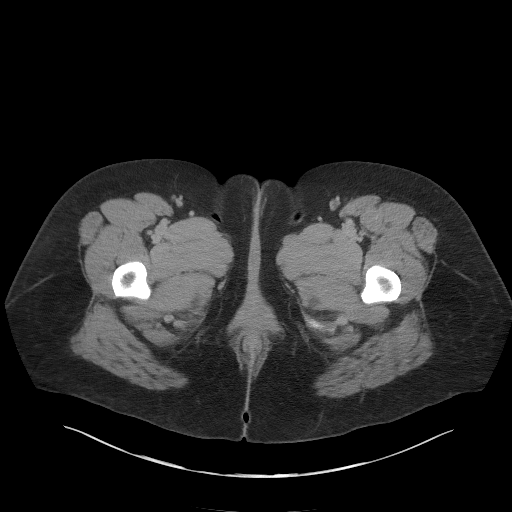
[im 5/102  bone]
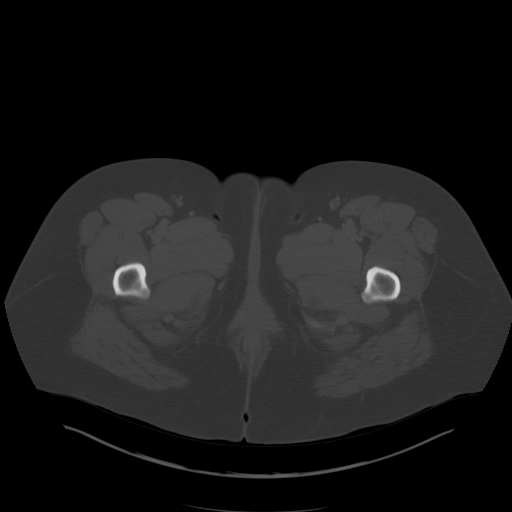
[im 14/102  soft-tissue]
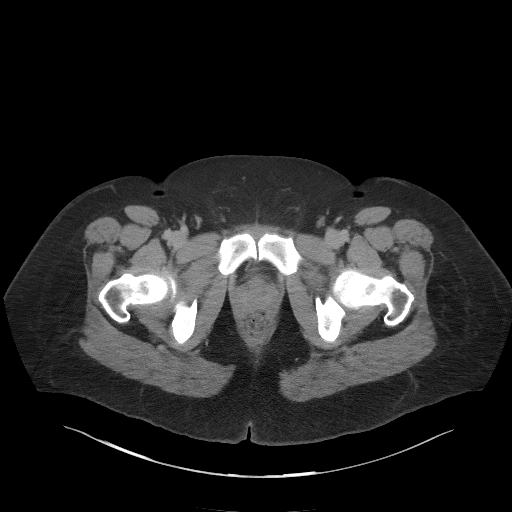
[im 23/102  soft-tissue]
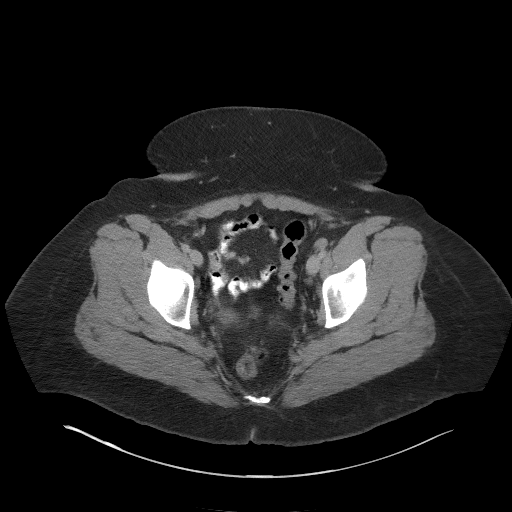
[im 28/102  soft-tissue]
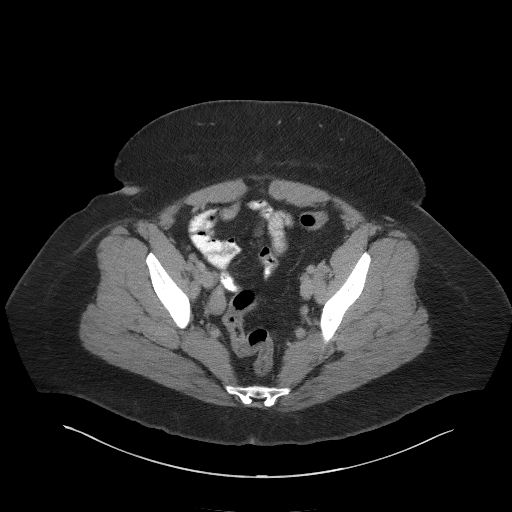
[im 37/102  soft-tissue]
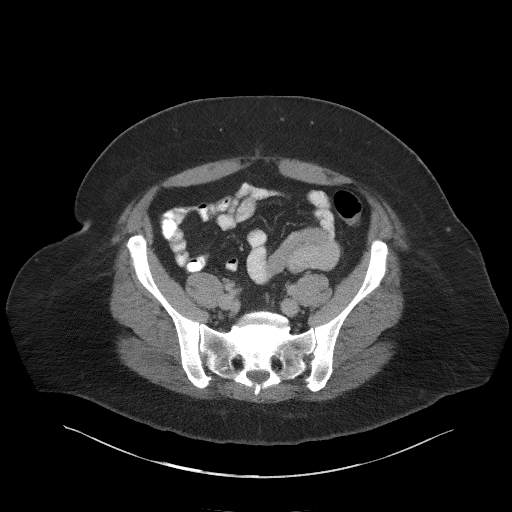
[im 42/102  soft-tissue]
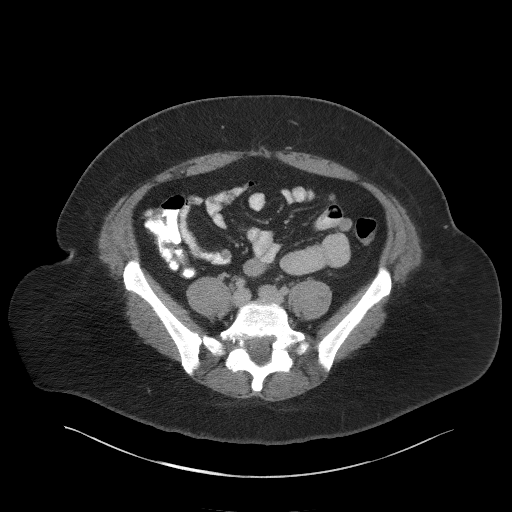
[im 51/102  soft-tissue]
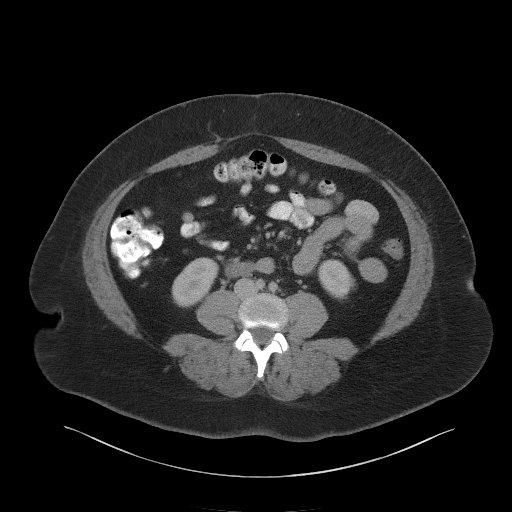
[im 60/102  soft-tissue]
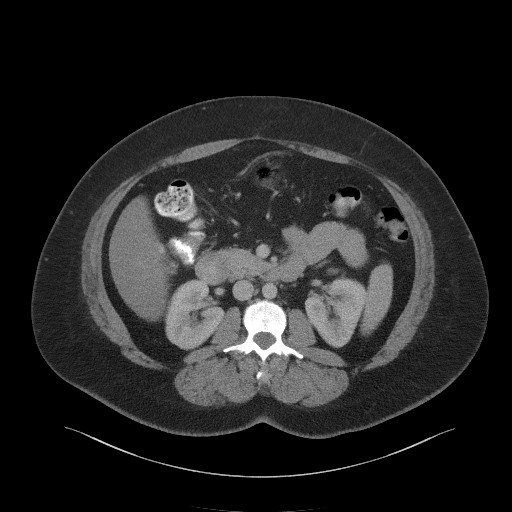
[im 65/102  soft-tissue]
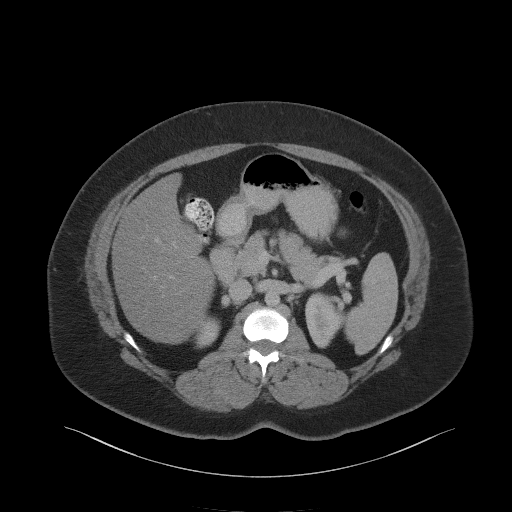
[im 65/102  bone]
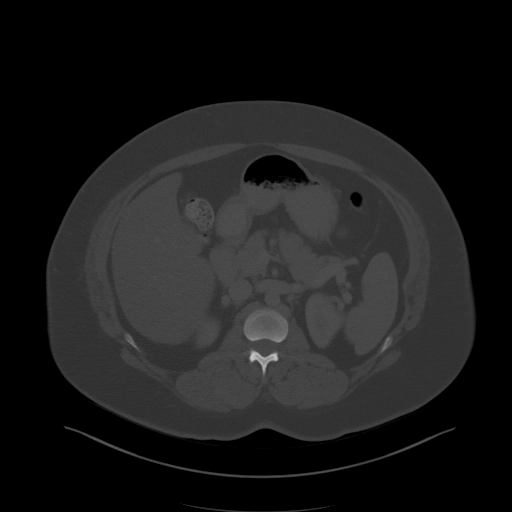
[im 74/102  soft-tissue]
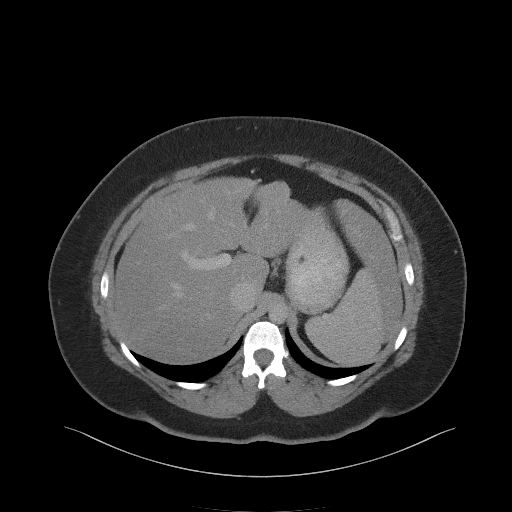
[im 79/102  soft-tissue]
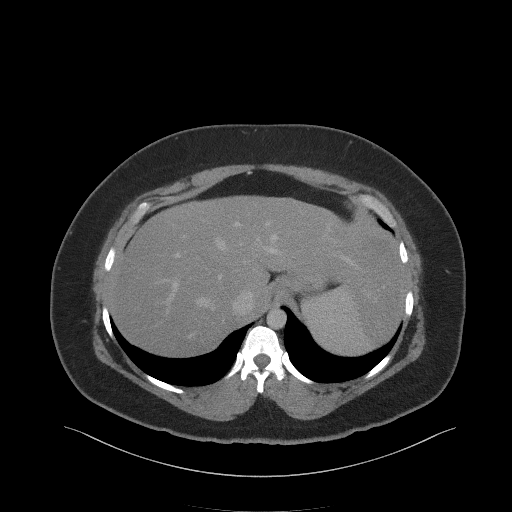
[im 88/102  soft-tissue]
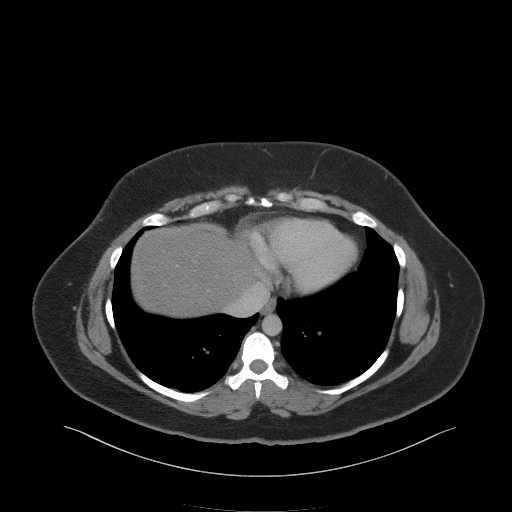
[im 97/102  soft-tissue]
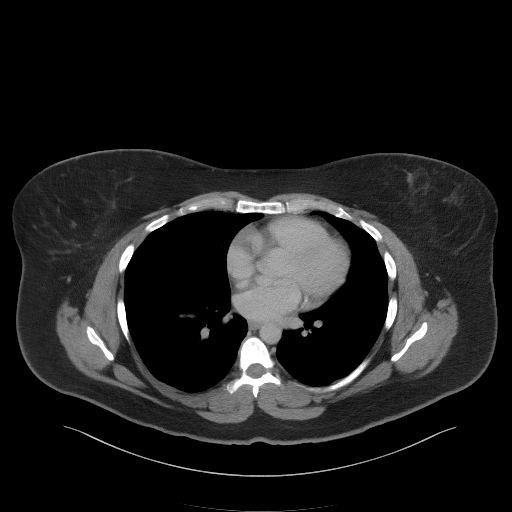

[Series 5: coronal st · coronal · 0.86mm/px · 3 of 106 slices shown]
[im 36/106  soft-tissue]
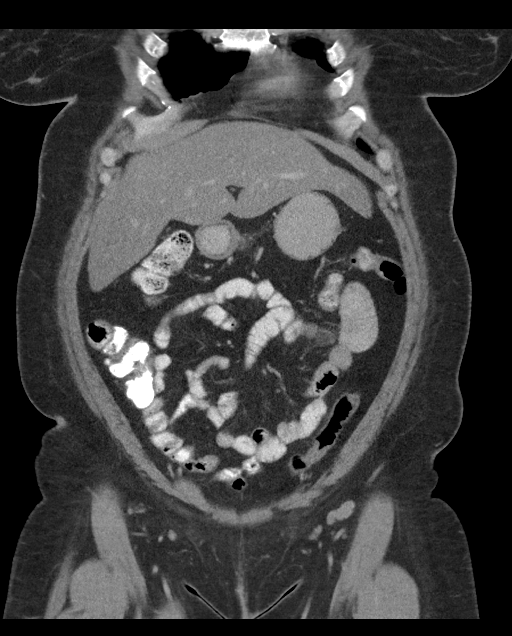
[im 47/106  soft-tissue]
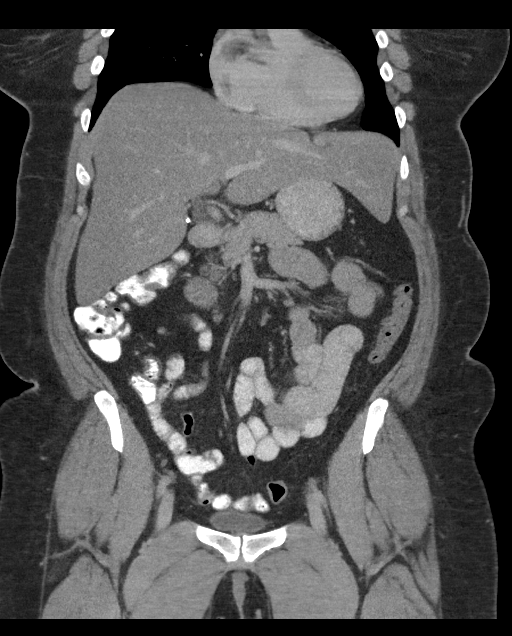
[im 59/106  soft-tissue]
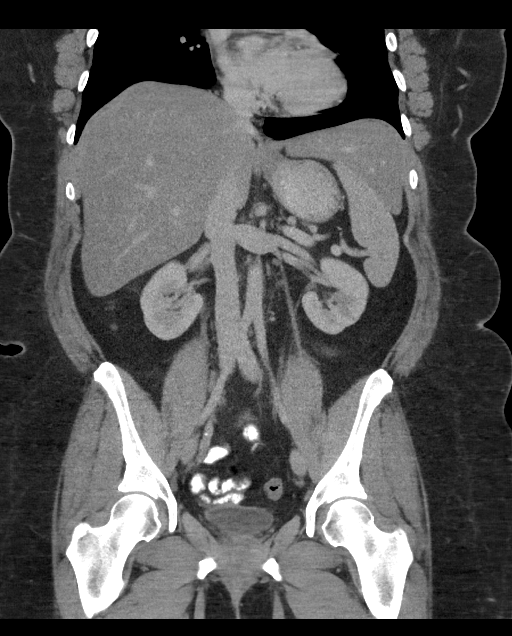

[16 of 46 positions shown; findings below may reference images not displayed]

FINDINGS: Lower chest: No consolidation or pleural fluid.

Hepatobiliary: Diffusely decreased hepatic density consistent with
steatosis. Decreased size of the partially exophytic left lobe liver
lesion measuring approximately 2.4 x 2.0 cm, previously 3.1 x
cm. No new focal hepatic lesion.

Pancreas: No ductal dilatation or inflammation.

Spleen: Normal in size without focal abnormality.

Adrenals/Urinary Tract: No adrenal nodule. No hydronephrosis or
perinephric edema. Homogeneous renal enhancement. Possible punctate
nonobstructing stone in the lower left kidney versus early excretion
of contrast. Urinary bladder is minimally distended. No bladder wall
thickening.

Stomach/Bowel: The stomach is physiologically distended without wall
thickening. No small bowel obstruction, inflammation or wall
thickening, enteric contrast reaches the colon. The appendix is
normal. No colonic inflammation or wall thickening. No significant
diverticular disease.

Vascular/Lymphatic: Prominent left external iliac node measures 11
mm short axis, previously 10 mm, likely reactive. Normal caliber
abdominal aorta.

Reproductive: Post hysterectomy. Right ovary is tentatively
identified and normal. Left ovary not definitively seen.

Other: No free air, free fluid, or intra-abdominal fluid collection.

Musculoskeletal: Bilateral L5 pars interarticularis defects with
grade 1 anterolisthesis of L5 on S1, unchanged from prior exam. No
focal osseous lesion.
IMPRESSION: 1. No acute abnormality or explanation for abdominal pain and fever.
No evidence of bowel inflammation. No intra-abdominal abscess.
2. Hepatic steatosis. Decreased size of benign left lobe liver
lesion from prior exams, previously characterized by MRI.

## 2019-07-04 IMAGING — DX DG CHEST 2V
2 series · 2 of 2 positions shown · non-contrast
Comparison: 01/08/2015

CLINICAL DATA: Chest pain and shortness of breath for 2 weeks.

EXAM:
CHEST - 2 VIEW

[chest pa]
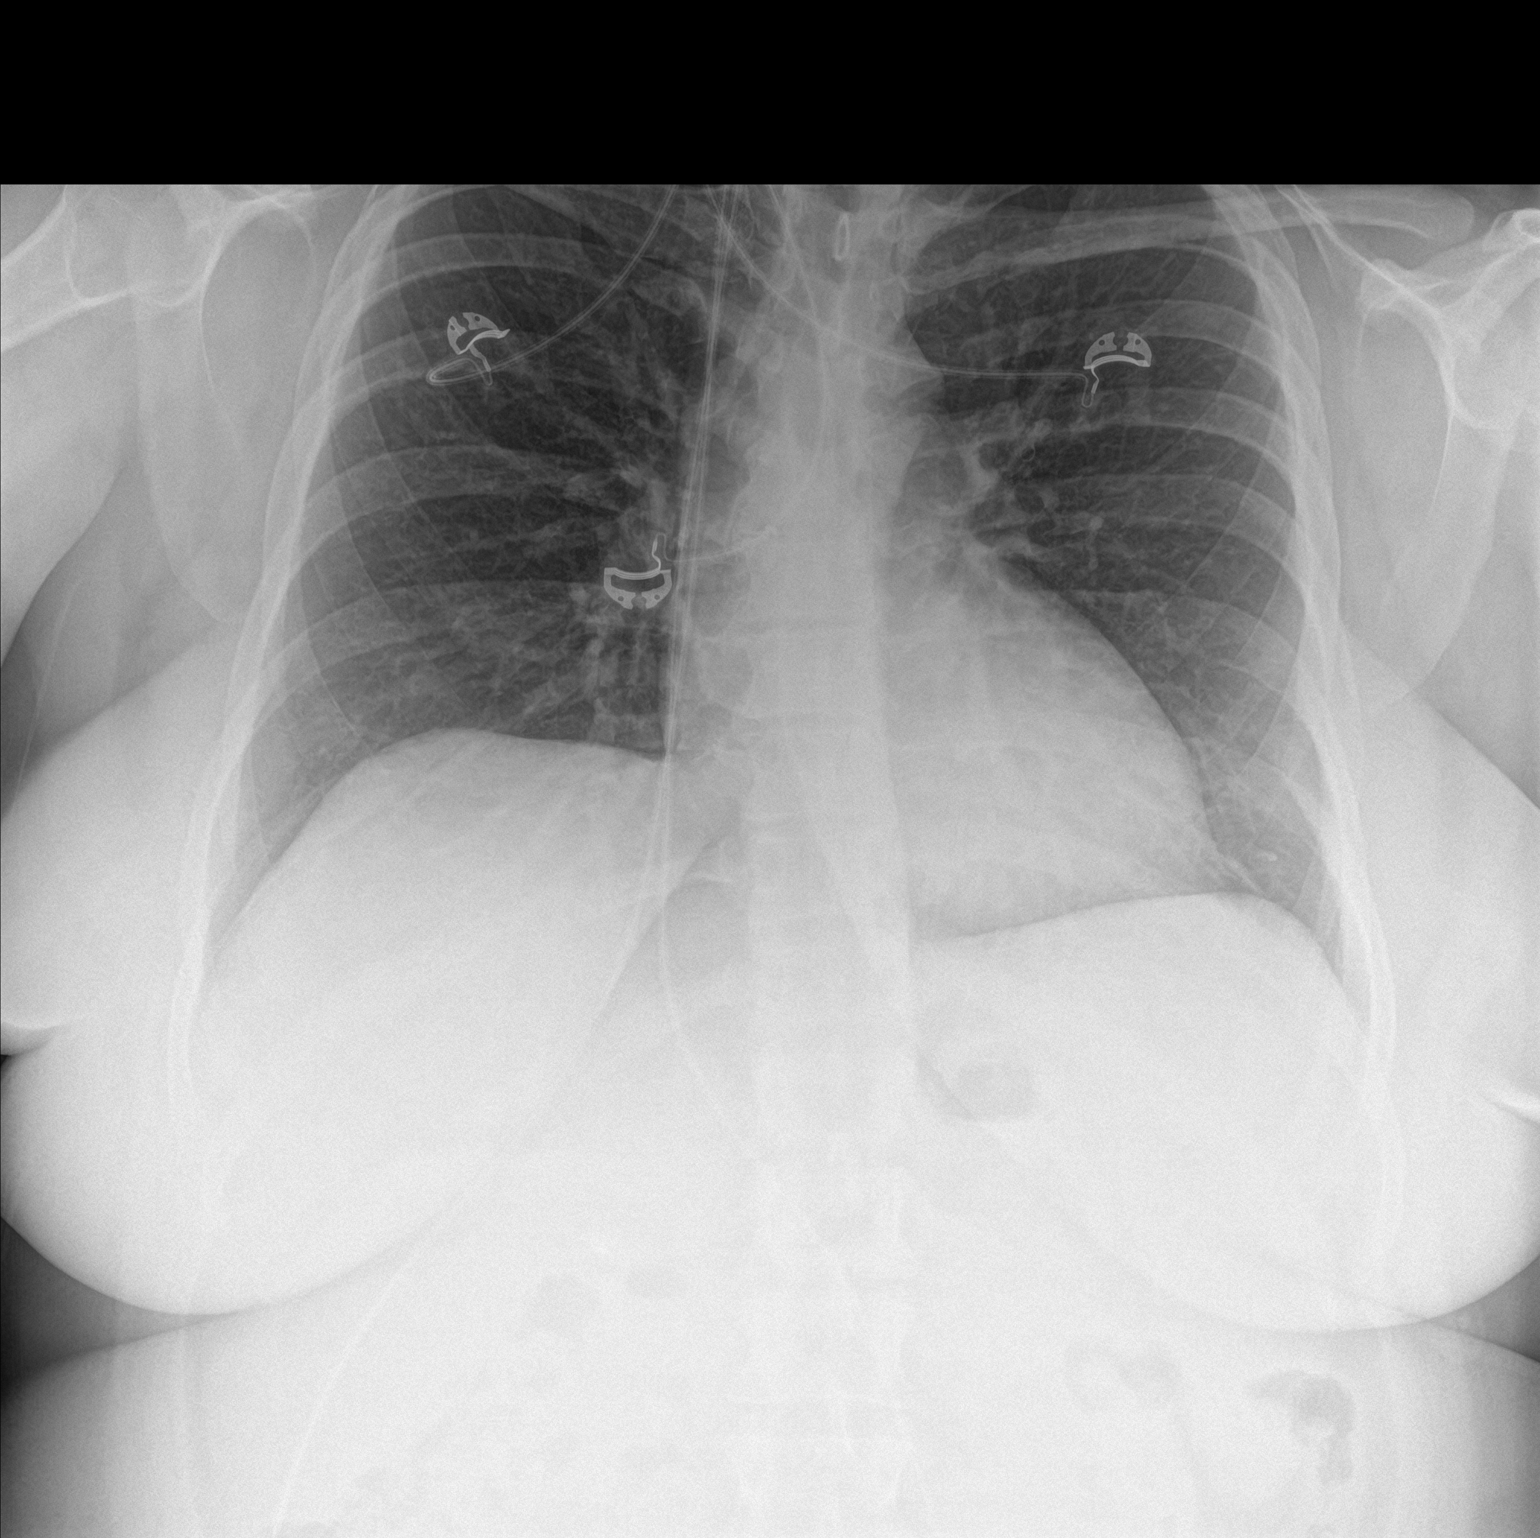

[chest lat]
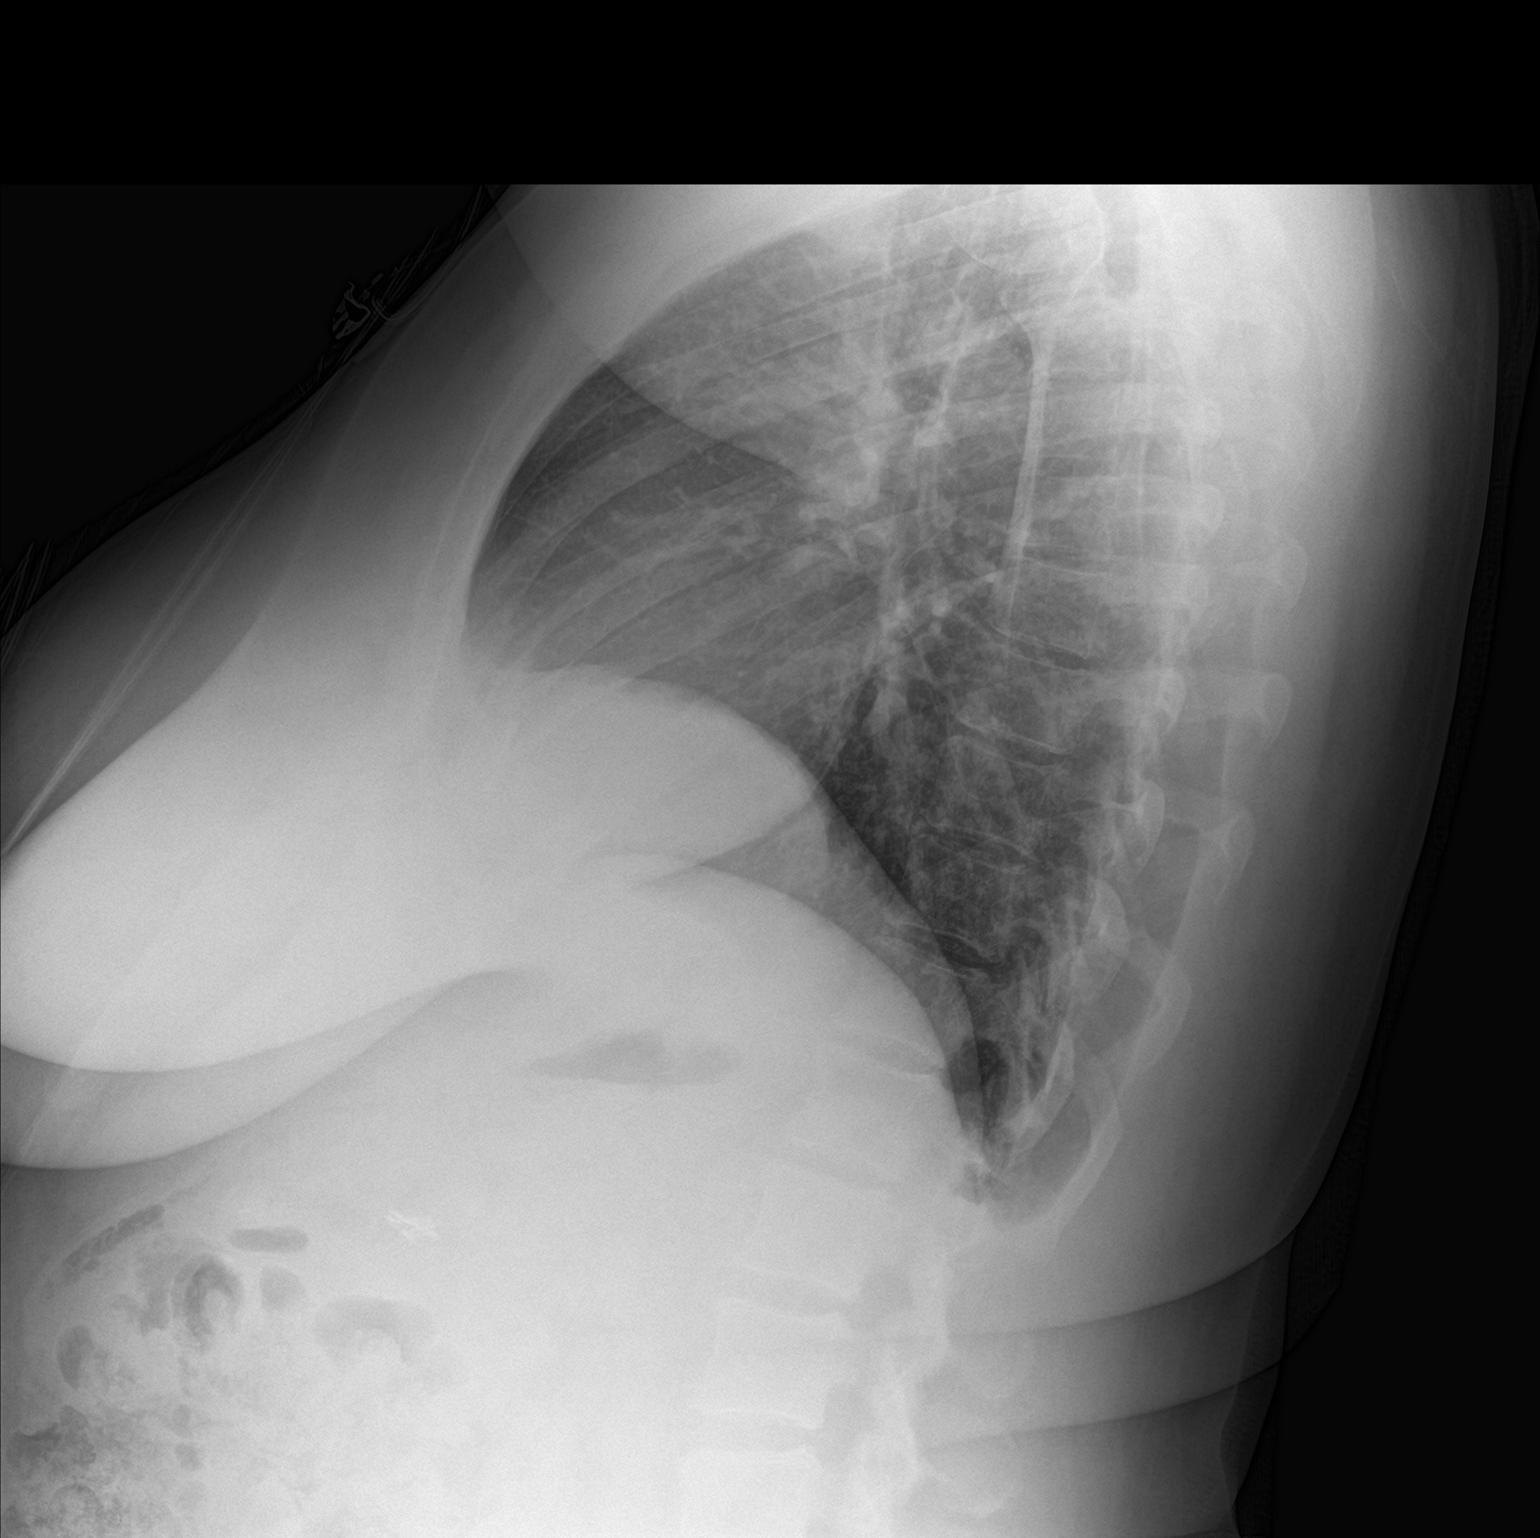

[2 of 2 positions shown; findings below may reference images not displayed]

FINDINGS: Heart size is normal. Both lungs are clear. Mild thoracic scoliosis
again noted.
IMPRESSION: No active cardiopulmonary disease.
